# Patient Record
Sex: Male | Born: 1960
Health system: Southern US, Community
[De-identification: ages and names within clinical notes are randomized; demographics above are authoritative.]

## PROBLEM LIST (undated history)

## (undated) DIAGNOSIS — N400 Enlarged prostate without lower urinary tract symptoms: Secondary | ICD-10-CM

---

## 2008-09-04 ENCOUNTER — Emergency Department (HOSPITAL_COMMUNITY): Admission: EM | Admit: 2008-09-04 | Discharge: 2008-09-04 | Payer: Self-pay | Admitting: Emergency Medicine

## 2012-10-14 ENCOUNTER — Emergency Department (HOSPITAL_COMMUNITY): Payer: Self-pay

## 2012-10-14 ENCOUNTER — Encounter (HOSPITAL_COMMUNITY): Payer: Self-pay | Admitting: Physical Medicine and Rehabilitation

## 2012-10-14 ENCOUNTER — Emergency Department (HOSPITAL_COMMUNITY)
Admission: EM | Admit: 2012-10-14 | Discharge: 2012-10-14 | Disposition: A | Discharge status: Home / Self Care | Payer: Self-pay | Attending: Emergency Medicine | Admitting: Emergency Medicine

## 2012-10-14 DIAGNOSIS — R7309 Other abnormal glucose: Secondary | ICD-10-CM | POA: Insufficient documentation

## 2012-10-14 DIAGNOSIS — R319 Hematuria, unspecified: Secondary | ICD-10-CM | POA: Insufficient documentation

## 2012-10-14 DIAGNOSIS — R739 Hyperglycemia, unspecified: Secondary | ICD-10-CM

## 2012-10-14 DIAGNOSIS — R109 Unspecified abdominal pain: Secondary | ICD-10-CM | POA: Insufficient documentation

## 2012-10-14 LAB — COMPREHENSIVE METABOLIC PANEL
ALT: 21 U/L (ref 0–53)
AST: 28 U/L (ref 0–37)
Alkaline Phosphatase: 67 U/L (ref 39–117)
CO2: 30 mEq/L (ref 19–32)
Calcium: 9.4 mg/dL (ref 8.4–10.5)
Chloride: 100 mEq/L (ref 96–112)
GFR calc Af Amer: >90 mL/min (ref >90)
GFR calc non Af Amer: >90 mL/min (ref >90)
Glucose, Bld: 117 mg/dL — ABNORMAL HIGH (ref 70–99)
Potassium: 3.5 mEq/L (ref 3.5–5.1)
Sodium: 137 mEq/L (ref 135–145)

## 2012-10-14 LAB — CBC WITH DIFFERENTIAL/PLATELET
Basophils Absolute: 0 10*3/uL (ref 0.0–0.1)
Lymphocytes Relative: 36 % (ref 12–46)
Lymphs Abs: 1.4 10*3/uL (ref 0.7–4.0)
MCV: 84.3 fL (ref 78.0–100.0)
Neutro Abs: 2.2 10*3/uL (ref 1.7–7.7)
Neutrophils Relative %: 57 % (ref 43–77)
Platelets: 200 10*3/uL (ref 150–400)
RBC: 5.02 MIL/uL (ref 4.22–5.81)
RDW: 12.5 % (ref 11.5–15.5)
WBC: 3.9 10*3/uL — ABNORMAL LOW (ref 4.0–10.5)

## 2012-10-14 LAB — URINALYSIS, ROUTINE W REFLEX MICROSCOPIC
Bilirubin Urine: NEGATIVE
Ketones, ur: NEGATIVE mg/dL
Nitrite: NEGATIVE
Specific Gravity, Urine: 1.017 (ref 1.005–1.030)
Urobilinogen, UA: 0.2 mg/dL (ref 0.0–1.0)

## 2012-10-14 LAB — URINE MICROSCOPIC-ADD ON

## 2012-10-14 MED ORDER — IOHEXOL 300 MG/ML  SOLN
100.0000 mL | Freq: Once | INTRAMUSCULAR | Status: AC | PRN
  Administered 2012-10-14: 100 mL via INTRAVENOUS

## 2012-10-14 MED ORDER — ONDANSETRON HCL 4 MG PO TABS: 4.0000 mg | ORAL_TABLET | Freq: Three times a day (TID) | ORAL | Status: DC | PRN

## 2012-10-14 MED ORDER — OXYCODONE-ACETAMINOPHEN 5-325 MG PO TABS: 1.0000 | ORAL_TABLET | ORAL | Status: DC | PRN

## 2012-10-14 MED ORDER — MORPHINE SULFATE 4 MG/ML IJ SOLN
4.0000 mg | Freq: Once | INTRAMUSCULAR | Status: AC | PRN
  Administered 2012-10-14: 4 mg via INTRAVENOUS
  Filled 2012-10-14: qty 1

## 2012-10-14 MED ORDER — ONDANSETRON HCL 4 MG/2ML IJ SOLN
4.0000 mg | INTRAMUSCULAR | Status: DC | PRN
  Administered 2012-10-14: 4 mg via INTRAVENOUS
  Filled 2012-10-14: qty 2

## 2012-10-14 MED ORDER — IOHEXOL 300 MG/ML  SOLN
50.0000 mL | Freq: Once | INTRAMUSCULAR | Status: AC | PRN
  Administered 2012-10-14: 50 mL via ORAL

## 2012-10-14 NOTE — ED Notes (Signed)
CT made aware pt finished drinking oral contrast

## 2012-10-14 NOTE — ED Provider Notes (Signed)
History     CSN: [id number]  Arrival date & time 10/14/12  1138   First MD Initiated Contact with Patient 10/14/12 1227      Chief Complaint  Patient presents with  . Abdominal Pain    (Consider location/radiation/quality/duration/timing/severity/associated sxs/prior treatment) HPI  Patient presents with cc abdominal pain. Patient is a poor historian. Language barrier. Patient's daughter translates. This morning at work he had sudden onset severe abdominal pain that became progressively worse. Patient left to get Pepto bismol at the drug store. Patient states that this pain  Became so severe that he could not drive away form the drug store/.  His pain eased up and he drove directly to the ED. patient states that the pain began in hi RUQ and back then moved down to his RLQ. The pain is described as colicky in nature. At times 10/10 but now 2/10. He has had frequency of urination but denies dysuria, hematuria. Denies nausea, vomiting, diarrhea, hematochezia, melena,. No PMH of similar. He does not drink alcohol.   No past medical history on file.  No past surgical history on file.  History reviewed. No pertinent family history.  History  Substance Use Topics  . Smoking status: Never Smoker   . Smokeless tobacco: Not on file  . Alcohol Use: No      Review of Systems Ten systems reviewed and are negative for acute change, except as noted in the HPI.   Allergies  Review of patient's allergies indicates no known allergies.  Home Medications   Current Outpatient Rx  Name  Route  Sig  Dispense  Refill  . Multiple Vitamin (MULTIVITAMIN WITH MINERALS) TABS   Oral   Take 1 tablet by mouth daily.           BP 136/73  Pulse 80  Temp(Src) 97.9 F (36.6 C) (Oral)  Resp 18  SpO2 98%  Physical Exam  Nursing note and vitals reviewed. Constitutional: He appears well-developed and well-nourished. No distress.  HENT:  Head: Normocephalic and atraumatic.  Eyes:  Conjunctivae are normal. No scleral icterus.  Neck: Normal range of motion. Neck supple.  Cardiovascular: Normal rate, regular rhythm and normal heart sounds.   Pulmonary/Chest: Effort normal and breath sounds normal. No respiratory distress.  Abdominal: Soft. He exhibits no distension and no mass. There is tenderness. There is no rebound and no guarding.  Poorly localized abdominal tenderness, worst in the RLQ. Moderately tender. No peritoneal signs.  Musculoskeletal: He exhibits no edema.  Neurological: He is alert.  Skin: Skin is warm and dry. He is not diaphoretic.  Psychiatric: His behavior is normal.    ED Course  Procedures (including critical care time)  Labs Reviewed  CBC WITH DIFFERENTIAL - Abnormal; Notable for the following:    WBC 3.9 (*)    MCHC 36.2 (*)    All other components within normal limits  COMPREHENSIVE METABOLIC PANEL - Abnormal; Notable for the following:    Glucose, Bld 117 (*)    All other components within normal limits  URINALYSIS, ROUTINE W REFLEX MICROSCOPIC - Abnormal; Notable for the following:    Hgb urine dipstick TRACE (*)    All other components within normal limits  LIPASE, BLOOD  URINE MICROSCOPIC-ADD ON   No results found.   1. Abdominal  pain, other specified site   2. Hyperglycemia   3. Hematuria       MDM  Patient with c/o abdominal pain. No signs of hernia. Diff. Includes appendicitis, kidney stone.  inflammatory bowel disease. No testiculary pain.  I will send patient for ct  Scan. Lab work up is otherwise negative except for trace hematuria and mild hyperglycemia.     Patient CT scan negative. His pain is resolved. Will d.c with pain medicine and have advised the patient to follow up with PCP. Patient is nontoxic, nonseptic appearing, in no apparent distress.  Patient's pain and other symptoms adequately managed in emergency department.  Fluid bolus given.  Labs, imaging and vitals reviewed.  Patient does not meet the SIRS or  Sepsis criteria.  On repeat exam patient does not have a surgical abdomin and there are nor peritoneal signs.  No indication of appendicitis, bowel obstruction, bowel perforation, cholecystitis, diverticulitis,  Patient discharged home with symptomatic treatment and given strict instructions for follow-up with their primary care physician.  I have also discussed reasons to return immediately to the ER.  Patient expresses understanding and agrees with plan.            Arthor Captain, PA-C 10/14/12 2111  Arthor Captain, PA-C 10/14/12 2125

## 2012-10-14 NOTE — ED Notes (Signed)
Abigail, PA back at the bedside with patient and family

## 2012-10-14 NOTE — ED Notes (Signed)
Abigail, PA at the bedside.  

## 2012-10-14 NOTE — ED Notes (Signed)
Pt presents to department for evaluation of epigastric pain. Onset this morning @ 09:45am. Denies nausea/vomiting/diarrhea. Pt is alert and oriented x4. 8/10 pain at the time.

## 2012-10-15 NOTE — ED Provider Notes (Signed)
Medical screening examination/treatment/procedure(s) were performed by non-physician practitioner and as supervising physician I was immediately available for consultation/collaboration.   Shaylah Mcghie B. Ragna Kramlich, MD 10/15/12 1936 

## 2013-11-02 ENCOUNTER — Ambulatory Visit: Payer: Self-pay

## 2013-12-11 ENCOUNTER — Ambulatory Visit (INDEPENDENT_AMBULATORY_CARE_PROVIDER_SITE_OTHER): Payer: No Typology Code available for payment source | Admitting: Family Medicine

## 2013-12-11 ENCOUNTER — Encounter: Payer: Self-pay | Admitting: Family Medicine

## 2013-12-11 VITALS — BP 121/68 | HR 86 | Temp 97.8°F | Resp 20 | Ht 72.0 in | Wt 146.0 lb

## 2013-12-11 DIAGNOSIS — Z1211 Encounter for screening for malignant neoplasm of colon: Secondary | ICD-10-CM

## 2013-12-11 DIAGNOSIS — R3915 Urgency of urination: Secondary | ICD-10-CM

## 2013-12-11 DIAGNOSIS — R3989 Other symptoms and signs involving the genitourinary system: Secondary | ICD-10-CM

## 2013-12-11 DIAGNOSIS — M545 Low back pain, unspecified: Secondary | ICD-10-CM

## 2013-12-11 DIAGNOSIS — R399 Unspecified symptoms and signs involving the genitourinary system: Secondary | ICD-10-CM

## 2013-12-11 DIAGNOSIS — R350 Frequency of micturition: Secondary | ICD-10-CM

## 2013-12-11 DIAGNOSIS — K5900 Constipation, unspecified: Secondary | ICD-10-CM

## 2013-12-11 DIAGNOSIS — Z789 Other specified health status: Secondary | ICD-10-CM

## 2013-12-11 DIAGNOSIS — K644 Residual hemorrhoidal skin tags: Secondary | ICD-10-CM

## 2013-12-11 DIAGNOSIS — R1032 Left lower quadrant pain: Secondary | ICD-10-CM

## 2013-12-11 LAB — CBC WITH DIFFERENTIAL/PLATELET
BASOS PCT: 1 % (ref 0–1)
Basophils Absolute: 0 10*3/uL (ref 0.0–0.1)
EOS ABS: 0 10*3/uL (ref 0.0–0.7)
Eosinophils Relative: 1 % (ref 0–5)
HEMATOCRIT: 44 % (ref 39.0–52.0)
HEMOGLOBIN: 15.3 g/dL (ref 13.0–17.0)
LYMPHS ABS: 1.1 10*3/uL (ref 0.7–4.0)
Lymphocytes Relative: 30 % (ref 12–46)
MCH: 30.4 pg (ref 26.0–34.0)
MCHC: 34.8 g/dL (ref 30.0–36.0)
MCV: 87.3 fL (ref 78.0–100.0)
MONO ABS: 0.4 10*3/uL (ref 0.1–1.0)
MONOS PCT: 10 % (ref 3–12)
Neutro Abs: 2 10*3/uL (ref 1.7–7.7)
Neutrophils Relative %: 58 % (ref 43–77)
Platelets: 250 10*3/uL (ref 150–400)
RBC: 5.04 MIL/uL (ref 4.22–5.81)
RDW: 14 % (ref 11.5–15.5)
WBC: 3.5 10*3/uL — ABNORMAL LOW (ref 4.0–10.5)

## 2013-12-11 LAB — IFOBT (OCCULT BLOOD): IFOBT: POSITIVE

## 2013-12-11 MED ORDER — HYDROCORTISONE 2.5 % RE CREA: 1.0000 "application " | TOPICAL_CREAM | Freq: Two times a day (BID) | RECTAL | Status: DC

## 2013-12-11 MED ORDER — POLYETHYLENE GLYCOL 3350 17 GM/SCOOP PO POWD: 17.0000 g | Freq: Every day | ORAL | Status: DC

## 2013-12-11 NOTE — Progress Notes (Signed)
Subjective:    Patient ID: Troy Harrison, male    DOB: [birth year], 53 y.o.   MRN: [id number]  HPI Patient in office to establish care. Patient is accompanied by interpreter. He primarily speaks spanish, yet understands [place].  States that he was seen by urgent care prior to establishing care.   Patient reports that he is having urinary frequency, urgency and trouble starting a stream at times. Symptoms have been occurring intermittently over the past 2 weeks. Patient states that he does not drink much water and has been working outside. He reports that he is not sexually active. Patient denies dysuria, pain, or bladder spasms.   Patient also complaining of blood in stool. He reports that he has had blood on tissue following defecation on several occasions. Patient reports that stools are hard and he finds himself straining with defecation at times. Patient reports that he has never had a colonoscopy. Patient reports occasional constipation and denies frank bleeding with defecation, nausea, vomiting, or diarrhea.    Review of Systems  Constitutional: Negative.   Gastrointestinal: Positive for nausea and blood in stool.  Genitourinary: Positive for urgency, frequency and difficulty urinating.  Musculoskeletal: Positive for back pain and myalgias.  Skin: Negative.   Allergic/Immunologic: Negative.   Neurological: Negative.  Negative for dizziness.  Hematological: Negative.   Psychiatric/Behavioral: Negative.        Objective:   Physical Exam  Constitutional: He is oriented to person, place, and time. He appears well-developed and well-nourished.  HENT:  Head: Normocephalic and atraumatic.  Right Ear: External ear normal.  Eyes: Conjunctivae are normal. Pupils are equal, round, and reactive to light.  Neck: Normal range of motion. Neck supple.  Cardiovascular: Normal rate, regular rhythm and normal heart sounds.   Pulmonary/Chest: Effort normal and breath sounds normal.   Abdominal: Soft. Bowel sounds are normal. Hernia confirmed negative in the right inguinal area and confirmed negative in the left inguinal area.  Genitourinary: Testes normal and penis normal. Rectal exam shows external hemorrhoid. Prostate is tender. Right testis shows no swelling and no tenderness. Circumcised.  Musculoskeletal: Normal range of motion.       Lumbar back: He exhibits normal range of motion, no tenderness, no swelling, no edema and no pain.  Lymphadenopathy:       Right: No inguinal adenopathy present.       Left: No inguinal adenopathy present.  Neurological: He is alert and oriented to person, place, and time.  Skin: Skin is warm and dry.  Psychiatric: He has a normal mood and affect. His behavior is normal. Judgment and thought content normal.          Assessment & Plan:  1. Urinary frequency/urgency: Increase water intake to 4-5 glasses per day. Add cranberry juice to fluid regimen. Empty bladder completely. Will check urinalysis.    2. Lower urinary tract symptoms: Patient reports that he has difficulty starting a stream at times. He states that he has not had a prostate exam in years. Checked prostate patient complained of mild tenderness during examination. Will check PSA  3. Constipation: Patient reports that he does not exercise. Recommend that he start a high fiber diet, increase water intake and exercise 3 days per week. Add Miralax 17 g daily  4. Abdominal pain to left lower quadrant: Patient complaining of discomfort to LLQ on palpation. Will start high fiber diet due to periodic constipation. Will check CMP and urinalysis to r/o UTI. Will refer for colonoscopy.   4. Hemorrhoids :  Patient reports that he has had blood on the tissues on wiping after defecation. Check occult blood, positive. External hemorrhoids present. Will start Hydrocortisone 2.5% rectal cream daily as needed. Recommend that patient start a high fiber diet. Added daily Miralax 17 g.   5.  Back pain: Patient complaining of back pain periodically.  Pain not reproducible on assessment. Discussed back exercises at length, demonstrated stretches. Apply warm, moist compresses to back 4 times per day for 20 minutes as needed. Recommend OTC Tylenol 500 mg every 6 hours as needed   Preventative:  Nonsmoker/non alcohol drinker Currently not sexually active Colonoscopy: Referral needed Vaccinations: Tdap 10 years ago, Tdap given without complication   Labs: CMP, UA, PSA, and occult blood RTC: 3 months for CPE Massie MaroonHollis,Bethannie Iglehart M, FNP

## 2013-12-12 LAB — URINALYSIS, COMPLETE
BACTERIA UA: NONE SEEN
Bilirubin Urine: NEGATIVE
Casts: NONE SEEN
Crystals: NONE SEEN
GLUCOSE, UA: NEGATIVE mg/dL
HGB URINE DIPSTICK: NEGATIVE
KETONES UR: NEGATIVE mg/dL
LEUKOCYTES UA: NEGATIVE
Nitrite: NEGATIVE
PROTEIN: NEGATIVE mg/dL
Specific Gravity, Urine: 1.029 (ref 1.005–1.030)
Squamous Epithelial / LPF: NONE SEEN
Urobilinogen, UA: 0.2 mg/dL (ref 0.0–1.0)
pH: 6.5 (ref 5.0–8.0)

## 2013-12-12 LAB — PSA: PSA: 0.5 ng/mL (ref <=4.00)

## 2013-12-12 LAB — COMPLETE METABOLIC PANEL WITH GFR
ALBUMIN: 4.4 g/dL (ref 3.5–5.2)
ALK PHOS: 60 U/L (ref 39–117)
ALT: 23 U/L (ref 0–53)
AST: 29 U/L (ref 0–37)
BUN: 22 mg/dL (ref 6–23)
CO2: 27 mEq/L (ref 19–32)
Calcium: 9.3 mg/dL (ref 8.4–10.5)
Chloride: 102 mEq/L (ref 96–112)
Creat: 1.19 mg/dL (ref 0.50–1.35)
GFR, Est African American: 80 mL/min
GFR, Est Non African American: 69 mL/min
Glucose, Bld: 87 mg/dL (ref 70–99)
POTASSIUM: 4.3 meq/L (ref 3.5–5.3)
SODIUM: 138 meq/L (ref 135–145)
TOTAL PROTEIN: 6.7 g/dL (ref 6.0–8.3)
Total Bilirubin: 0.4 mg/dL (ref 0.2–1.2)

## 2013-12-17 DIAGNOSIS — Z789 Other specified health status: Secondary | ICD-10-CM | POA: Insufficient documentation

## 2013-12-17 DIAGNOSIS — M545 Low back pain, unspecified: Secondary | ICD-10-CM | POA: Insufficient documentation

## 2013-12-17 DIAGNOSIS — R399 Unspecified symptoms and signs involving the genitourinary system: Secondary | ICD-10-CM | POA: Insufficient documentation

## 2013-12-17 DIAGNOSIS — R3915 Urgency of urination: Secondary | ICD-10-CM | POA: Insufficient documentation

## 2013-12-17 DIAGNOSIS — Z1211 Encounter for screening for malignant neoplasm of colon: Secondary | ICD-10-CM | POA: Insufficient documentation

## 2013-12-17 DIAGNOSIS — R1032 Left lower quadrant pain: Secondary | ICD-10-CM | POA: Insufficient documentation

## 2013-12-17 DIAGNOSIS — K644 Residual hemorrhoidal skin tags: Secondary | ICD-10-CM | POA: Insufficient documentation

## 2013-12-17 DIAGNOSIS — R350 Frequency of micturition: Secondary | ICD-10-CM | POA: Insufficient documentation

## 2013-12-17 DIAGNOSIS — K5900 Constipation, unspecified: Secondary | ICD-10-CM | POA: Insufficient documentation

## 2014-03-14 ENCOUNTER — Encounter: Payer: Self-pay | Admitting: Internal Medicine

## 2014-03-14 ENCOUNTER — Ambulatory Visit (INDEPENDENT_AMBULATORY_CARE_PROVIDER_SITE_OTHER): Payer: No Typology Code available for payment source | Admitting: Internal Medicine

## 2014-03-14 VITALS — BP 122/62 | HR 62 | Temp 97.8°F | Resp 16 | Ht 73.0 in | Wt 159.0 lb

## 2014-03-14 DIAGNOSIS — N508 Other specified disorders of male genital organs: Secondary | ICD-10-CM

## 2014-03-14 DIAGNOSIS — G4710 Hypersomnia, unspecified: Secondary | ICD-10-CM

## 2014-03-14 DIAGNOSIS — N5089 Other specified disorders of the male genital organs: Secondary | ICD-10-CM

## 2014-03-14 DIAGNOSIS — K644 Residual hemorrhoidal skin tags: Secondary | ICD-10-CM

## 2014-03-14 DIAGNOSIS — G4730 Sleep apnea, unspecified: Secondary | ICD-10-CM

## 2014-03-14 DIAGNOSIS — Z23 Encounter for immunization: Secondary | ICD-10-CM

## 2014-03-14 DIAGNOSIS — R400 Somnolence: Secondary | ICD-10-CM

## 2014-03-14 DIAGNOSIS — Z0000 Encounter for general adult medical examination without abnormal findings: Secondary | ICD-10-CM

## 2014-03-14 MED ORDER — HYDROCORTISONE 2.5 % RE CREA: 1.0000 "application " | TOPICAL_CREAM | Freq: Two times a day (BID) | RECTAL | Status: DC

## 2014-03-14 NOTE — Progress Notes (Signed)
Patient ID: Troy Harrison, male   DOB: Sep 15, 1960, 53 y.o.   MRN: [id number]   Troy Harrison, is a 53 y.o. male  JXB:[id number]  [id number]:[id number]  DOB - [birth year]  CC:  Chief Complaint  Patient presents with  . Annual Exam       HPI: Troy Harrison is a 53 y.o. male here today for Annual Examination. He reports that he has had problems with nocturnal urgency and decreased stream for more than 2 years. He feels that he has to have increased effort to "pass urine" at night.   He also c/o daytime sleepiness and reports that his wife has observed him with apneic spells at night and his score on the Epworth sleepiness scale is 17 (moderte risk).   Pt also reports that he has a difficult time hearing low tones and in large rooms. He however, does hear at normal tones.   Patient has No headache, No chest pain, No abdominal pain - No Nausea, No new weakness tingling or numbness, No Cough - SOB.  No Known Allergies History reviewed. No pertinent past medical history. Current Outpatient Prescriptions on File Prior to Visit  Medication Sig Dispense Refill  . polyethylene glycol powder (GLYCOLAX/MIRALAX) powder Take 17 g by mouth daily.  3350 g  1  . Multiple Vitamin (MULTIVITAMIN WITH MINERALS) TABS Take 1 tablet by mouth daily.      . ondansetron (ZOFRAN) 4 MG tablet Take 1 tablet (4 mg total) by mouth every 8 (eight) hours as needed for nausea.  10 tablet  0  . oxyCODONE-acetaminophen (PERCOCET) 5-325 MG per tablet Take 1-2 tablets by mouth every 4 (four) hours as needed for pain.  10 tablet  0   No current facility-administered medications on file prior to visit.   History reviewed. No pertinent family history. History   Social History  . Marital Status: Married    Spouse Name: N/A    Number of Children: N/A  . Years of Education: N/A   Occupational History  . Not on file.   Social History Main Topics  . Smoking status: Never Smoker   . Smokeless  tobacco: Not on file  . Alcohol Use: No  . Drug Use: No  . Sexual Activity: Not on file   Other Topics Concern  . Not on file   Social History Narrative  . No narrative on file    Review of Systems: Constitutional: Negative for fever, chills, diaphoresis, activity change, appetite change and fatigue. HENT: Negative for ear pain, nosebleeds, congestion, facial swelling, rhinorrhea, neck pain, neck stiffness and ear discharge.  Eyes: Negative for pain, discharge, redness, itching and visual disturbance. Respiratory: Negative for cough, choking, chest tightness, shortness of breath, wheezing and stridor.  Cardiovascular: Negative for chest pain, palpitations and leg swelling. Gastrointestinal: Negative for abdominal distention. Genitourinary: Negative for dysuria, urgency, frequency, hematuria, flank pain, decreased urine volume, difficulty urinating.  Musculoskeletal: Negative for back pain, joint swelling, arthralgia and gait problem. Neurological: Negative for dizziness, tremors, seizures, syncope, facial asymmetry, speech difficulty, weakness, light-headedness, numbness and headaches.  Hematological: Negative for adenopathy. Does not bruise/bleed easily. Psychiatric/Behavioral: Negative for hallucinations, behavioral problems, confusion, dysphoric mood, decreased concentration and agitation.    Objective:    Filed Vitals:   03/14/14 1112  BP: 122/62  Pulse: 62  Temp: 97.8 F (36.6 C)  Resp: 16    Physical Exam: Constitutional: Patient appears well-developed and well-nourished. No distress. HENT: Normocephalic, atraumatic, External right and left ear normal. Oropharynx is clear and  moist.  Eyes: Conjunctivae and EOM are normal. PERRLA, no scleral icterus. Neck: Normal ROM. Neck supple. No JVD. No tracheal deviation. No thyromegaly. CVS: RRR, S1/S2 +, no murmurs, no gallops, no carotid bruit.  Pulmonary: Effort and breath sounds normal, no stridor, rhonchi, wheezes, rales.   Abdominal: Soft. BS +, no distension, tenderness, rebound or guarding.  Genitalia: Pt with large scrotal volume on rihgt as compared to left which seems to be absent Musculoskeletal: Normal range of motion. No edema and no tenderness.  Lymphadenopathy: No lymphadenopathy noted, cervical, inguinal or axillary Neuro: Alert. Normal reflexes, muscle tone coordination. No cranial nerve deficit. Skin: Skin is warm and dry. No rash noted. Not diaphoretic. No erythema. No pallor. Psychiatric: Normal mood and affect. Behavior, judgment, thought content normal.  Lab Results  Component Value Date   WBC 3.5* 12/11/2013   HGB 15.3 12/11/2013   HCT 44.0 12/11/2013   MCV 87.3 12/11/2013   PLT 250 12/11/2013   Lab Results  Component Value Date   CREATININE 1.19 12/11/2013   BUN 22 12/11/2013   NA 138 12/11/2013   K 4.3 12/11/2013   CL 102 12/11/2013   CO2 27 12/11/2013    No results found for this basename: HGBA1C   Lipid Panel  No results found for this basename: chol, trig, hdl, cholhdl, vldl, ldlcalc       Assessment and plan:   1. Annual physical exam - EKG 12-Lead - Lipid panel - Vit D  25 hydroxy (rtn osteoporosis monitoring)  2. Need for Tdap vaccination - Tdap vaccine greater than or equal to 7yo IM  3. Immunization due - Flu Vaccine QUAD 36+ mos PF IM (Fluarix Quad PF)  4. Hemorrhoids, external without complications - hydrocortisone (ANUSOL-HC) 2.5 % rectal cream; Place 1 application rectally 2 (two) times daily.  Dispense: 30 g; Refill: 2  5. 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 It is unclear if he has a left undescend scrotum on mass int he left scrotum. Both of which indicate risk for testicular cancer - US Scrotum; Future  6. Urinary Dysfunction - Prostate examination normal and PSA was normal. His urinalysis was also found to be normal . The fact that he only has symptoms at night does not fir any particular pattern.    7. Positive Hemoccult - Pt did have a positive guaiac and has been referred for Colonoscopy both for screening and postitive hemoccult. However his normal CBC is reassuring.    Follow-up in 3 months.  The patient was given clear instructions to go to ER or return to medical center if symptoms don't improve, worsen or new problems develop. The patient verbalized understanding. The patient was told to call to get lab results if they haven't heard anything in the next week.     This note has been created with Education officer, environmental. Any transcriptional errors are unintentional.    Kenia Teagarden A., MD [facility] [place], [place] [phone]   03/14/2014, 12:15 PM

## 2014-03-15 LAB — VITAMIN D 25 HYDROXY (VIT D DEFICIENCY, FRACTURES): Vit D, 25-Hydroxy: 28 ng/mL — ABNORMAL LOW (ref 30–89)

## 2014-03-15 LAB — LIPID PANEL
CHOL/HDL RATIO: 2.1 ratio
Cholesterol: 172 mg/dL (ref 0–200)
HDL: 82 mg/dL (ref >39)
LDL CALC: 73 mg/dL (ref 0–99)
TRIGLYCERIDES: 85 mg/dL (ref <150)
VLDL: 17 mg/dL (ref 0–40)

## 2014-03-21 ENCOUNTER — Ambulatory Visit: Payer: No Typology Code available for payment source | Admitting: Internal Medicine

## 2014-03-21 ENCOUNTER — Encounter: Payer: Self-pay | Admitting: Internal Medicine

## 2014-03-21 NOTE — Progress Notes (Unsigned)
Patient here to establish care Patient states was seen last week at [facility] for  An issue with his prostate Patient complains of having hemorrhoids as well

## 2014-05-08 ENCOUNTER — Ambulatory Visit: Payer: No Typology Code available for payment source | Attending: Internal Medicine

## 2014-06-10 ENCOUNTER — Ambulatory Visit: Payer: Self-pay | Attending: Internal Medicine

## 2014-06-18 ENCOUNTER — Ambulatory Visit (HOSPITAL_BASED_OUTPATIENT_CLINIC_OR_DEPARTMENT_OTHER): Payer: Self-pay | Attending: Internal Medicine | Admitting: Radiology

## 2014-06-18 DIAGNOSIS — G4700 Insomnia, unspecified: Secondary | ICD-10-CM | POA: Insufficient documentation

## 2014-06-18 DIAGNOSIS — R400 Somnolence: Secondary | ICD-10-CM

## 2014-06-18 DIAGNOSIS — G4733 Obstructive sleep apnea (adult) (pediatric): Secondary | ICD-10-CM | POA: Insufficient documentation

## 2014-06-18 DIAGNOSIS — G4710 Hypersomnia, unspecified: Secondary | ICD-10-CM

## 2014-06-18 DIAGNOSIS — G4730 Sleep apnea, unspecified: Secondary | ICD-10-CM

## 2014-06-18 DIAGNOSIS — R0683 Snoring: Secondary | ICD-10-CM

## 2014-06-22 DIAGNOSIS — G4730 Sleep apnea, unspecified: Secondary | ICD-10-CM

## 2014-06-22 DIAGNOSIS — G4710 Hypersomnia, unspecified: Secondary | ICD-10-CM

## 2014-06-22 DIAGNOSIS — R0683 Snoring: Secondary | ICD-10-CM

## 2014-06-22 NOTE — Sleep Study (Signed)
   NAME: Troy Harrison DATE OF BIRTH:  [birth year] MEDICAL RECORD NUMBER [id number]  LOCATION: [facility]  PHYSICIAN: Karlisa Gaubert D  DATE OF STUDY: 06/18/2014  SLEEP STUDY TYPE: Nocturnal Polysomnogram               REFERRING PHYSICIAN: Altha Harm, MD  INDICATION FOR STUDY: Hypersomnia with sleep apnea  EPWORTH SLEEPINESS SCORE:   9/24 HEIGHT:   6 feet WEIGHT:   170 pounds    BMI 23 NECK SIZE: 14 in.  MEDICATIONS: Charted for review  SLEEP ARCHITECTURE: Total sleep time 152 minutes with sleep efficiency 35.6%. Stage I was 13.8%, stage II 77.3%, stage III absent, REM 8.9% of total sleep time. Sleep latency 143 minutes, REM latency 228.5 minutes, awake after sleep onset 133.5 minutes, arousal index 60, bedtime medication: None  RESPIRATORY DATA: Apnea hypopnea index (AHI) 5.5 per hour 14 total events scored including 6 obstructive apneas, 1 central apnea, 3 mixed apneas, 4 hypopneas. All events were associated with supine sleep position REM AHI 0. There were not enough events to permit application of split protocol CPAP titration.  OXYGEN DATA: Very loud snoring with oxygen desaturation to a nadir of 90% and mean saturation 96.4% on room air  CARDIAC DATA: Sinus rhythm with PACs, PVCs and intervals of supraventricular tachycardia.  MOVEMENT/PARASOMNIA: 28 limb jerks were counted of which 8 were associated with arousal or awakening for periodic limb movement with arousal index of 3.2 per hour. No bathroom trips. He was described by the technician as "extremely restless throughout the recording".  IMPRESSION/ RECOMMENDATION:   1) Significant difficulty initiating and maintaining sleep. Sustained sleep was not achieved until after 1 AM and he was then awake again intermittently and for about 45 minutes between 2 AM and 3 AM. If this is representative of his sleep pattern at home, consider management as insomnia 2) Minimal obstructive sleep  apnea/hypopnea syndrome, AHI 5.5 per hour with supine events. The normal range for adults is an AHI from 0-5 events per hour. Very loud snoring with oxygen desaturation to a nadir of 90% and mean oxygen saturation 96.4% on room air. Disproportionately loud snoring suggests the possibility of upper airway obstruction such as nasal obstruction or large tonsils, which might be addressed.   Troy Harrison Diplomate, American Board of Sleep Medicine  ELECTRONICALLY SIGNED ON:  06/22/2014, 10:54 AM [facility] PH: [phone]   FX: [phone] ACCREDITED BY THE AMERICAN ACADEMY OF SLEEP MEDICINE

## 2016-01-12 ENCOUNTER — Ambulatory Visit (INDEPENDENT_AMBULATORY_CARE_PROVIDER_SITE_OTHER): Payer: Self-pay

## 2016-01-12 ENCOUNTER — Encounter (HOSPITAL_COMMUNITY): Payer: Self-pay | Admitting: Emergency Medicine

## 2016-01-12 ENCOUNTER — Ambulatory Visit (HOSPITAL_COMMUNITY)
Admission: EM | Admit: 2016-01-12 | Discharge: 2016-01-12 | Disposition: A | Discharge status: Home / Self Care | Payer: Self-pay | Attending: Internal Medicine | Admitting: Internal Medicine

## 2016-01-12 DIAGNOSIS — K5900 Constipation, unspecified: Secondary | ICD-10-CM

## 2016-01-12 DIAGNOSIS — R140 Abdominal distension (gaseous): Secondary | ICD-10-CM

## 2016-01-12 DIAGNOSIS — R350 Frequency of micturition: Secondary | ICD-10-CM

## 2016-01-12 LAB — POCT URINALYSIS DIP (DEVICE)
Bilirubin Urine: NEGATIVE
Glucose, UA: NEGATIVE mg/dL
Ketones, ur: NEGATIVE mg/dL
LEUKOCYTES UA: NEGATIVE
NITRITE: NEGATIVE
PROTEIN: NEGATIVE mg/dL
SPECIFIC GRAVITY, URINE: 1.015 (ref 1.005–1.030)
UROBILINOGEN UA: 0.2 mg/dL (ref 0.0–1.0)
pH: 7.5 (ref 5.0–8.0)

## 2016-01-12 MED ORDER — POLYETHYLENE GLYCOL 3350 17 GM/SCOOP PO POWD: 17.0000 g | Freq: Every day | ORAL | 0 refills | Status: AC

## 2016-01-12 MED ORDER — TAMSULOSIN HCL 0.4 MG PO CAPS: 0.4000 mg | ORAL_CAPSULE | Freq: Every day | ORAL | 0 refills | Status: AC

## 2016-01-12 NOTE — ED Provider Notes (Signed)
[facility]    CSN: [id number] Arrival date & time: 01/12/16  1113  First Provider Contact:  None       History   Chief Complaint Stomach pain   HPI Troy Harrison is a 55 y.o. male. He presents today with several weeks history of urinary hesitancy, small volumes, intermittent dysuria. Nocturia 4-5 times per night. This is worse in the last 2-3 weeks. In the last 3 days he has had some sensation of bloating. Has a chronic history of unformed stools for the last 8 or 10 years, no real change. However, this morning he had a smaller bowel movements than usual, and is little concerned about this. He also acknowledges mild testicular discomfort of uncertain duration. No rash. No abdominal pain at present, does intermittently have crampiness of the right and left lower quadrants. Describes appetite is being a little diminished, little bit of nausea, no vomiting. Tactile temperature. Mild fatigue.   HPI  History reviewed. No pertinent past medical history.  Patient Active Problem List   Diagnosis Date Noted  . Language barrier to communication 12/17/2013  . Unspecified constipation 12/17/2013  . Hemorrhoids, external without complications 12/17/2013  . Urinary frequency 12/17/2013  . Urinary urgency 12/17/2013  . Midline low back pain without sciatica 12/17/2013  . Lower urinary tract symptoms 12/17/2013  . Abdominal pain, left lower quadrant 12/17/2013  . Encounter for screening colonoscopy 12/17/2013    History reviewed. No pertinent surgical history.     Home Medications    Prior to Admission medications   Medication Sig Start Date End Date Taking? Authorizing Provider  naproxen sodium (FLANAX PAIN RELIEF) 220 MG tablet Take 220 mg by mouth 2 (two) times daily with a meal.   Yes Historical Provider, MD  Multiple Vitamin (MULTIVITAMIN WITH MINERALS) TABS Take 1 tablet by mouth daily.    Historical Provider, MD    Family History History reviewed. No  pertinent family history.  Social History Social History  Substance Use Topics  . Smoking status: Never Smoker  . Smokeless tobacco: Never Used  . Alcohol use No     Allergies   Review of patient's allergies indicates no known allergies.   Review of Systems Review of Systems  All other systems reviewed and are negative.    Physical Exam  Updated Vital Signs BP 130/66 (BP Location: Left Arm)   Pulse 61   Temp 97.4 F (36.3 C) (Oral)   Resp 12   SpO2 100%  Physical Exam  Constitutional: He appears well-developed and well-nourished.  HENT:  Head: Normocephalic and atraumatic.  Eyes: Conjunctivae are normal.  Neck: Neck supple.  Cardiovascular: Normal rate and regular rhythm.   No murmur heard. Pulmonary/Chest: Effort normal and breath sounds normal. No respiratory distress.  Abdominal: Soft. He exhibits no distension and no mass. There is no rebound and no guarding.  Very mild tenderness to deep palpation in the left lower quadrant.  Musculoskeletal: He exhibits no edema.  Neurological: He is alert.  Skin: Skin is warm and dry. No rash noted.  Psychiatric: He has a normal mood and affect.  Nursing note and vitals reviewed.    UC Treatments / Results   Results for orders placed or performed during the hospital encounter of 01/12/16  POCT urinalysis dip (device)  Result Value Ref Range   Glucose, UA NEGATIVE NEGATIVE mg/dL   Bilirubin Urine NEGATIVE NEGATIVE   Ketones, ur NEGATIVE NEGATIVE mg/dL   Specific Gravity, Urine 1.015 1.005 - 1.030  Hgb urine dipstick TRACE (A) NEGATIVE   pH 7.5 5.0 - 8.0   Protein, ur NEGATIVE NEGATIVE mg/dL   Urobilinogen, UA 0.2 0.0 - 1.0 mg/dL   Nitrite NEGATIVE NEGATIVE   Leukocytes, UA NEGATIVE NEGATIVE  Urine cytology ancillary only  Result Value Ref Range   Chlamydia Negative    Neisseria gonorrhea Negative     Radiology  CLINICAL DATA:  55 year old male with abdominal discomfort, bloating and  constipation. EXAM: DG ABDOMEN ACUTE W/ 1V CHEST COMPARISON:  CT the abdomen pelvis 10/14/2012. FINDINGS: Lung volumes are normal. No consolidative airspace disease. No pleural effusions. No pneumothorax. No pulmonary nodule or mass noted. Pulmonary vasculature and the cardiomediastinal silhouette are within normal limits. Gas and stool are seen scattered throughout the colon extending to the level of the distal rectum. No pathologic distension of small bowel is noted. No gross evidence of pneumoperitoneum. Atherosclerotic calcifications in the pelvic vessels. IMPRESSION: 1.   1.  Nonobstructive bowel gas pattern. 2. No pneumoperitoneum. 3. No radiographic evidence of acute cardiopulmonary disease. 4. Atherosclerosis. Electronically Signed   By: Trudie Reed M.D.   On: 01/12/2016 13:31  Procedures Procedures (including critical care time) None   Final Clinical Impressions(s) / UC Diagnoses   Final diagnoses:  Bloating  Urinary frequency  Constipation, unspecified constipation type   Prescription of tamsulosin to try for a month, to see if improvement in urinary symptoms occurs.  Follow up with a primary care provider or urologist for further evaluation and management of urinary symptoms.  Prescription for polyethylene glycol (miralax) to try, for decreased frequency bowel movements.  Xray of abdomen was unremarkable today.  Urinalysis was unremarkable today.        Eustace Moore, MD 01/27/16 [id number]

## 2016-01-12 NOTE — ED Notes (Signed)
Patient's bladder showed a residual post void volume of 80 mls.

## 2016-01-12 NOTE — ED Triage Notes (Signed)
Video translator # [id number] used for triage and physician evaluation. The patient presented to the [facility] with a complaint of abdominal pain x 3 weeks..the patient stated that he has been having bilateral testicular pain and decreased urinary output. The patient also reported constipation.

## 2016-01-12 NOTE — ED Provider Notes (Signed)
CSN: [id number]     Arrival date & time 01/12/16  1113 History   None    No chief complaint on file.  (Consider location/radiation/quality/duration/timing/severity/associated sxs/prior Treatment) Patient c/o hard bm yesterday and some abdominal bloating and abdominal discomfort.    Abdominal Pain  Pain location:  Generalized Pain quality: bloating   Pain radiates to:  Does not radiate Pain severity:  No pain Timing:  Intermittent Progression:  Waxing and waning Chronicity:  New Context: eating   Relieved by:  Nothing Worsened by:  Nothing Ineffective treatments:  None tried Associated symptoms: constipation     No past medical history on file. No past surgical history on file. No family history on file. Social History  Substance Use Topics  . Smoking status: Never Smoker  . Smokeless tobacco: Not on file  . Alcohol use No    Review of Systems  Constitutional: Negative.   HENT: Negative.   Respiratory: Negative.   Cardiovascular: Negative.   Gastrointestinal: Positive for abdominal pain and constipation.  Endocrine: Negative.   Genitourinary: Negative.   Musculoskeletal: Negative.   Skin: Negative.   Allergic/Immunologic: Negative.   Neurological: Negative.   Psychiatric/Behavioral: Negative.     Allergies  Review of patient's allergies indicates no known allergies.  Home Medications   Prior to Admission medications   Medication Sig Start Date End Date Taking? Authorizing Provider  hydrocortisone (ANUSOL-HC) 2.5 % rectal cream Place 1 application rectally 2 (two) times daily. 03/14/14   Altha Harm, MD  Multiple Vitamin (MULTIVITAMIN WITH MINERALS) TABS Take 1 tablet by mouth daily.    Historical Provider, MD  ondansetron (ZOFRAN) 4 MG tablet Take 1 tablet (4 mg total) by mouth every 8 (eight) hours as needed for nausea. 10/14/12   Arthor Captain, PA-C  oxyCODONE-acetaminophen (PERCOCET) 5-325 MG per tablet Take 1-2 tablets by mouth every 4 (four)  hours as needed for pain. 10/14/12   Arthor Captain, PA-C  polyethylene glycol powder (GLYCOLAX/MIRALAX) powder Take 17 g by mouth daily. 12/11/13   Massie Maroon, FNP   Meds Ordered and Administered this Visit  Medications - No data to display  BP 130/66 (BP Location: Left Arm)   Pulse 61   Temp 97.4 F (36.3 C) (Oral)   Resp 12   SpO2 100%  No data found.   Physical Exam  Constitutional: He appears well-developed and well-nourished.  HENT:  Head: Normocephalic and atraumatic.  Eyes: Conjunctivae and EOM are normal. Pupils are equal, round, and reactive to light.  Neck: Normal range of motion. Neck supple.  Cardiovascular: Normal rate, regular rhythm and normal heart sounds.   Pulmonary/Chest: Effort normal and breath sounds normal.  Abdominal: Soft. Bowel sounds are normal. He exhibits distension.    Urgent Care Course   Clinical Course    Procedures (including critical care time)  Labs Review Labs Reviewed - No data to display  Imaging Review No results found.   Visual Acuity Review  Right Eye Distance:   Left Eye Distance:   Bilateral Distance:    Right Eye Near:   Left Eye Near:    Bilateral Near:         MDM  Bloating Constipation  Miralax 17 grams qd  Push po fluids Add fiber to diet  Follow up prn    Deatra Canter, FNP 01/15/16 1651

## 2016-01-12 NOTE — Discharge Instructions (Addendum)
Prescription of tamsulosin to try for a month, to see if improvement in urinary symptoms occurs.  Follow up with a primary care provider or urologist for further evaluation and management of urinary symptoms.  Prescription for polyethylene glycol (miralax) to try, for decreased frequency bowel movements.  Xray of abdomen was unremarkable today.  Urinalysis was unremarkable today.

## 2016-01-13 LAB — URINE CYTOLOGY ANCILLARY ONLY
Chlamydia: NEGATIVE
Neisseria Gonorrhea: NEGATIVE

## 2016-02-03 ENCOUNTER — Other Ambulatory Visit: Payer: Self-pay

## 2016-02-03 ENCOUNTER — Ambulatory Visit (HOSPITAL_COMMUNITY)
Admission: EM | Admit: 2016-02-03 | Discharge: 2016-02-03 | Disposition: A | Discharge status: Home / Self Care | Payer: Self-pay | Attending: Family Medicine | Admitting: Family Medicine

## 2016-02-03 ENCOUNTER — Encounter (HOSPITAL_COMMUNITY): Payer: Self-pay | Admitting: *Deleted

## 2016-02-03 DIAGNOSIS — R42 Dizziness and giddiness: Secondary | ICD-10-CM

## 2016-02-03 LAB — POCT I-STAT, CHEM 8
BUN: 18 mg/dL (ref 6–20)
CREATININE: 0.8 mg/dL (ref 0.61–1.24)
Calcium, Ion: 1.18 mmol/L (ref 1.13–1.30)
Chloride: 100 mmol/L — ABNORMAL LOW (ref 101–111)
Glucose, Bld: 85 mg/dL (ref 65–99)
HEMATOCRIT: 47 % (ref 39.0–52.0)
HEMOGLOBIN: 16 g/dL (ref 13.0–17.0)
Potassium: 4.3 mmol/L (ref 3.5–5.1)
Sodium: 139 mmol/L (ref 135–145)
TCO2: 26 mmol/L (ref 0–100)

## 2016-02-03 MED ORDER — MECLIZINE HCL 25 MG PO TABS: 25.0000 mg | ORAL_TABLET | Freq: Three times a day (TID) | ORAL | 0 refills | Status: DC | PRN

## 2016-02-03 MED FILL — ?MECLIZINE 25 MG TABLET: 25 | 10 days supply | Qty: 30 | Fill #0

## 2016-02-03 NOTE — ED Provider Notes (Signed)
[facility]    CSN: [id number] Arrival date & time: 02/03/16  1017  First Provider Contact:  First MD Initiated Contact with Patient 02/03/16 1028        History   Chief Complaint No chief complaint on file.   HPI Vertell LimberRamon Rosales-Gonzalez is a 55 y.o. male.    Dizziness  Quality:  Lightheadedness Severity:  Moderate Onset quality:  Sudden Duration:  2 days Timing:  Intermittent Chronicity:  New Context: not with loss of consciousness   Relieved by:  None tried Worsened by:  Nothing Ineffective treatments:  None tried Associated symptoms: chest pain, headaches, nausea and palpitations   Associated symptoms: no shortness of breath     No past medical history on file.  Patient Active Problem List   Diagnosis Date Noted  . Language barrier to communication 12/17/2013  . Unspecified constipation 12/17/2013  . Hemorrhoids, external without complications 12/17/2013  . Urinary frequency 12/17/2013  . Urinary urgency 12/17/2013  . Midline low back pain without sciatica 12/17/2013  . Lower urinary tract symptoms 12/17/2013  . Abdominal pain, left lower quadrant 12/17/2013  . Encounter for screening colonoscopy 12/17/2013    No past surgical history on file.     Home Medications    Prior to Admission medications   Medication Sig Start Date End Date Taking? Authorizing Provider  Multiple Vitamin (MULTIVITAMIN WITH MINERALS) TABS Take 1 tablet by mouth daily.    Historical Provider, MD  naproxen sodium (FLANAX PAIN RELIEF) 220 MG tablet Take 220 mg by mouth 2 (two) times daily with a meal.    Historical Provider, MD  polyethylene glycol powder (GLYCOLAX/MIRALAX) powder Take 17 g by mouth daily. 01/12/16 02/11/16  Eustace MooreLaura W Murray, MD  tamsulosin (FLOMAX) 0.4 MG CAPS capsule Take 1 capsule (0.4 mg total) by mouth daily. 01/12/16 02/11/16  Eustace MooreLaura W Murray, MD    Family History No family history on file.  Social History Social History  Substance Use Topics  .  Smoking status: Never Smoker  . Smokeless tobacco: Never Used  . Alcohol use No     Allergies   Review of patient's allergies indicates no known allergies.   Review of Systems Review of Systems  Constitutional: Positive for fatigue.  HENT: Negative for congestion.   Respiratory: Negative.  Negative for shortness of breath.   Cardiovascular: Positive for chest pain and palpitations.  Gastrointestinal: Positive for nausea.  Neurological: Positive for dizziness and headaches.  All other systems reviewed and are negative.    Physical Exam Triage Vital Signs ED Triage Vitals  Enc Vitals Group     BP      Pulse      Resp      Temp      Temp src      SpO2      Weight      Height      Head Circumference      Peak Flow      Pain Score      Pain Loc      Pain Edu?      Excl. in GC?    No data found.   Updated Vital Signs There were no vitals taken for this visit.  Visual Acuity Right Eye Distance:   Left Eye Distance:   Bilateral Distance:    Right Eye Near:   Left Eye Near:    Bilateral Near:     Physical Exam  Constitutional: He is oriented to person, place, and time.  He appears well-developed and well-nourished.  HENT:  Right Ear: External ear normal.  Left Ear: External ear normal.  Mouth/Throat: Oropharynx is clear and moist.  Eyes: Pupils are equal, round, and reactive to light.  Neck: Normal range of motion. Neck supple.  Cardiovascular: Normal rate, regular rhythm, normal heart sounds and intact distal pulses.   Pulmonary/Chest: Effort normal and breath sounds normal.  Abdominal: Soft. Bowel sounds are normal. There is no tenderness.  Lymphadenopathy:    He has no cervical adenopathy.  Neurological: He is alert and oriented to person, place, and time.  Skin: Skin is warm and dry.  Nursing note and vitals reviewed.    UC Treatments / Results  Labs (all labs ordered are listed, but only abnormal results are displayed  I-stat  wnl.  Radiology No results found.  Procedures Procedures (including critical care time)  Medications Ordered in UC Medications - No data to display   ED ECG REPORT   Date: 02/03/2016  Rate: 76  Rhythm: normal sinus rhythm  QRS Axis: normal  Intervals: normal  ST/T Wave abnormalities: normal  Conduction Disutrbances:none  Narrative Interpretation:   Old EKG Reviewed: unchanged  I have personally reviewed the EKG tracing and agree with the computerized printout as noted.   Initial Impression / Assessment and Plan / UC Course  I have reviewed the triage vital signs and the nursing notes.  Pertinent labs & imaging results that were available during my care of the patient were reviewed by me and considered in my medical decision making (see chart for de   Final Clinical Impressions(s) / UC Diagnoses   Final diagnoses:  None    New Prescriptions New Prescriptions   No medications on file     Linna HoffJames D Mikeila Burgen, MD 02/03/16 1119

## 2016-02-03 NOTE — ED Triage Notes (Signed)
Pt  Was   Seen   approx  3  Weeks  Ago  For  abd  Pain/  Constipation     And  Bladder  Issues    Pt  Was  Placed   On  Laxative     And   flomax      He  Reports  Being  Dizzy     And  Having  Vague  abd  Pain   pacifixc  Interpretors  Utilized

## 2016-02-04 ENCOUNTER — Ambulatory Visit: Payer: Self-pay | Attending: Internal Medicine

## 2016-02-06 ENCOUNTER — Inpatient Hospital Stay: Payer: Self-pay | Admitting: Family Medicine

## 2016-03-03 ENCOUNTER — Ambulatory Visit: Payer: Self-pay | Attending: Internal Medicine | Admitting: Internal Medicine

## 2016-03-03 ENCOUNTER — Encounter: Payer: Self-pay | Admitting: Internal Medicine

## 2016-03-03 VITALS — BP 144/75 | HR 68 | Temp 98.1°F | Resp 16 | Wt 169.2 lb

## 2016-03-03 DIAGNOSIS — Z7189 Other specified counseling: Secondary | ICD-10-CM | POA: Insufficient documentation

## 2016-03-03 DIAGNOSIS — N401 Enlarged prostate with lower urinary tract symptoms: Secondary | ICD-10-CM | POA: Insufficient documentation

## 2016-03-03 DIAGNOSIS — Z114 Encounter for screening for human immunodeficiency virus [HIV]: Secondary | ICD-10-CM | POA: Insufficient documentation

## 2016-03-03 DIAGNOSIS — Z1211 Encounter for screening for malignant neoplasm of colon: Secondary | ICD-10-CM

## 2016-03-03 DIAGNOSIS — Z1159 Encounter for screening for other viral diseases: Secondary | ICD-10-CM

## 2016-03-03 DIAGNOSIS — I10 Essential (primary) hypertension: Secondary | ICD-10-CM | POA: Insufficient documentation

## 2016-03-03 DIAGNOSIS — Z0000 Encounter for general adult medical examination without abnormal findings: Secondary | ICD-10-CM | POA: Insufficient documentation

## 2016-03-03 DIAGNOSIS — Z1389 Encounter for screening for other disorder: Secondary | ICD-10-CM | POA: Insufficient documentation

## 2016-03-03 DIAGNOSIS — N400 Enlarged prostate without lower urinary tract symptoms: Secondary | ICD-10-CM

## 2016-03-03 DIAGNOSIS — Z79899 Other long term (current) drug therapy: Secondary | ICD-10-CM | POA: Insufficient documentation

## 2016-03-03 LAB — BASIC METABOLIC PANEL WITH GFR
BUN: 17 mg/dL (ref 7–25)
CALCIUM: 9.4 mg/dL (ref 8.6–10.3)
CO2: 26 mmol/L (ref 20–31)
Chloride: 104 mmol/L (ref 98–110)
Creat: 0.78 mg/dL (ref 0.70–1.33)
GFR, Est Non African American: >89 mL/min (ref >=60)
GLUCOSE: 98 mg/dL (ref 65–99)
Potassium: 4.4 mmol/L (ref 3.5–5.3)
Sodium: 137 mmol/L (ref 135–146)

## 2016-03-03 LAB — PSA: PSA: 0.5 ng/mL (ref <=4.0)

## 2016-03-03 MED ORDER — TAMSULOSIN HCL 0.4 MG PO CAPS: 0.4000 mg | ORAL_CAPSULE | Freq: Every day | ORAL | 3 refills | Status: DC

## 2016-03-03 MED FILL — TAMSULOSIN HCL 0.4 MG CAP: 0.4 | 30 days supply | Qty: 30 | Fill #0

## 2016-03-03 NOTE — Patient Instructions (Signed)
Hiperplasia prosttica benigna  (Benign Prostatic Hypertrophy) La glndula prosttica forma parte del sistema reproductor masculino. Una prstata normal tiene aproximadamente el tamao de una nuez. Produce un lquido que se mezcla con el esperma para formar el semen. Esta glndula rodea la uretra y se ubica en frente del recto y por debajo de la vejiga. La vejiga es el lugar en el que se acumula la orina. La uretra es el conducto que lleva la orina desde la vejiga hacia el exterior del cuerpo. La prstata crece a medida que el hombre envejece. Cuando la prstata se agranda por otras causas que no sean cncer, producen un trastorno llamado hipertrofia prosttica benigna (HPB). Una prstata agrandada puede presionar la uretra. Esto puede dificultar el pasaje de la orina. En las primeras etapas del agrandamiento, la vejiga puede adaptarse al fortalecer los msculos para impulsar la orina en la uretra ms estrecha. Si el problema contina o empeora, requerir tratamiento mdico o quirrgico.  Esta afeccin debe ser controlada por su mdico. La acumulacin de orina en la vejiga puede causar una infeccin. La presin y la infeccin pueden llevar a un dao en la vejiga y a una insuficiencia en los riones (renal). Si es necesario, el mdico podr derivarlo a un especialista en enfermedades del rin y de la prstata (urlogo). CAUSAS  La hiperplasia prosttica benigna es un problema en hombres mayores de 50 aos. Esta afeccin es una parte normal del envejecimiento. Sin embargo, no todos los hombres desarrollarn problemas por esta afeccin. Si el agrandamiento va ms all de la uretra, no habr compresin de la uretra ni resistencia al flujo de la orina. Si el agrandamiento es hacia la uretra y la comprime, experimentar dificultad para orinar.  SNTOMAS   Dificultad para vaciar completamente la vejiga.  Levantarse con frecuencia durante la noche para orinar.  Necesidad de orinar con ms frecuencia durante el  da.  Dificultad para comenzar a eliminar la orina.  Disminucin del tamao y la fuerza del chorro de orina.  Goteo al terminar de orinar.  Dolor al orinar (ms frecuente en caso de infeccin).  Imposibilidad para orinar. En este caso es necesario un tratamiento inmediato.  Desarrollo de una infeccin en el tracto urinario. DIAGNSTICO  Estos estudios ayudarn al mdico a diagnosticar el problema.  Una exhaustiva historia clnica y examen fsico.  Historia de su forma de orinar, con el nmero de veces y la cantidad que orina, la fuerza del chorro de orina y la sensacin de haber vaciado o no la vejiga despus de orinar.  Un estudio despus de vaciar la vejiga que mide la cantidad de orina que queda en la vejiga despus de terminar de orinar.  Examen rectal digital. En el examen rectal digital, el mdico controla la prstata colocando un dedo enguantado y lubricado en el recto para sentir la parte posterior de la prstata. En este estudio se detecta el tamao de la glndula y si hay bultos o crecimientos.  Anlisis de orina (urinlisis).  Estudio de antgeno prosttico especfico. Este es un anlisis de sangre que se utiliza para diagnosticar el cncer de prstata.  Ecografa rectal. En este estudio se utilizan ondas de sonido para producir de manera electrnica una imagen de la glndula prosttica. TRATAMIENTO  Una vez que los sntomas comienzan, el mdico controlar la afeccin. De los hombres que sufren esta afeccin, un tercio tendr sntomas que se estabilizan, un tercio tendr sntomas que mejoran y un tercio tendr sntomas que avanzan durante el primer ao. Los sntomas   leves pueden no necesitar tratamiento. Una simple observacin y exmenes anuales pudiera ser todo lo que necesita. Los medicamentos y la Azerbaijanciruga son opciones para los problemas ms graves. El mdico lo ayudar a tomar una decisin acerca de lo que resulte lo mejor para usted. Dos tipos de medicamentos estn  disponibles para el alivio de los sntomas prostticos:  Hay medicamentos para IT trainerachicar la prstata. Esto ayuda a Asbury Automotive Groupaliviar los sntomas. Estos medicamentos tardan en Scientist, water qualityhacer efecto y pueden pasar meses antes de que se observe alguna mejora.  Los efectos adversos poco frecuentes incluyen problemas con la funcin sexual.  Medicamentos para relajar el msculo de la prstata. Esto tambin The Procter & Gamblealivia la obstruccin al reducir la compresin sobre la uretra. Este grupo de medicamentos funciona ms rpido que aquellos que reducen el tamao de la glndula prosttica. Generalmente se experimenta una mejora en Gannett Coalgunos das o semanas.  Los efectos adversos incluyen [place] Manormareos, Ravennafatiga, [place]braska[place] varios tipos de tratamiento quirrgico disponibles para Paramedicaliviar los sntomas de la prstata:  Reseccin transuretral de la prstata (RTUP): En este tipo de tratamiento, se inserta un instrumento a travs de la abertura de la punta del pene. Se cortan trozos del Solicitorcentro interior de la prstata. Los trozos se retiran a Games developertravs de la misma abertura del pene. Esto mejora la obstruccin y [place] and the Grenadinesayuda a Eastman Kodakaliviar los sntomas.  Incisin transuretral (ITUP): En este procedimiento se hacen pequeos cortes en la prstata. sto hace que se alivie la presin de la prstata sobre la uretra.  Termoterapia transuretral con microondas (TTUM): En este procedimiento se utilizan microondas para crear calor. El calor destruye y extirpa una pequea cantidad de tejido prosttico.  Ablacin transuretral con aguja (ATUA): Este es un procedimiento en el que se utilizan frecuencias de radio para Radio producerhacer lo mismo que en el TTUM.  Coagulacin intersticial con lser (CIL): En este procedimiento se utiliza un rayo lser para hacer lo mismo que en el TTUM y el ATUA.  Electrovaporizacin transuretral (EVTU): En este procedimiento se utilizan electrodos para hacer los mismo que en los procedimientos enumerados anteriormente. SOLICITE ATENCIN MDICA SI:   Lance Mussiene  fiebre.  Siente un dolor inexplicable en la espalda.  Los sntomas no se alivian con los medicamentos recetados.  Los Toys ''R'' Usmedicamentos le causan BB&T Corporationefectos adversos.  La orina se vuelve muy oscura o tiene mal olor.  Siente que la parte inferior del abdomen est distendida y tiene dificultad para Geographical information systems officerorinar. SOLICITE ATENCIN MDICA DE INMEDIATO SI:   Repentinamente no puede orinar. Esto es Radio broadcast assistantuna emergencia. Debe ser atendido inmediatamente.  Observa gran cantidad de sangre o cogulos sanguneos en la orina.  No puede controlar sus problemas urinarios.  Se siente confundido, tiene mareos intensos o se desmaya.  Siente dolor moderado a intenso en la espalda o en un flanco.  Siente escalofros o le sube la fiebre.   Esta informacin no tiene Theme park managercomo fin reemplazar el consejo del mdico. Asegrese de hacerle al mdico cualquier pregunta que tenga.   Document Released: 06/07/2005 Document Revised: 06/12/2013 Elsevier Interactive Patient Education 2016 Arvin[employer]itorElsevier Inc.  -  Hipertensin (Hypertension) El trmino hipertensin es otra forma de denominar a la presin arterial elevada. La presin arterial elevada fuerza al corazn a trabajar ms para bombear la sangre. Una lectura de la presin arterial consta de dos nmeros: uno ms alto sobre uno ms bajo (por ejemplo, 110/72). CUIDADOS EN EL HOGAR   Haga que el mdico le tome nuevamente la presin arterial.  Tome los medicamentos solamente como se lo haya indicado el mdico. Siga  cuidadosamente las indicaciones. Los medicamentos pierden eficacia si omite dosis. El hecho de omitir las dosis tambin [place] el riesgo de otros problemas.  No fume.  Contrlese la presin arterial en su casa como se lo haya indicado el mdico. SOLICITE AYUDA SI:  Piensa que tiene una reaccin a los medicamentos que est tomando.  Tiene mareos o dolores de cabeza reiterados.  Se le inflaman (hinchan) los tobillos.  Tiene problemas de visin. SOLICITE AYUDA DE  INMEDIATO SI:   Tiene un dolor de cabeza muy intenso y est confundido.  Se siente dbil, aturdido o se desmaya.  Tiene dolor en el pecho o el estmago (abdominal).  Tiene vmitos.  No puede respirar Kimberly[employer]ark. ASEGRESE DE QUE:   Comprende estas instrucciones.  Controlar su afeccin.  Recibir ayuda de inmediato si no mejora o si empeora.   Esta informacin no tiene [name] el consejo del mdico. Asegrese de hacerle al mdico cualquier pregunta que tenga.   Document Released: 11/25/2009 Document Revised: 06/12/2013 Elsevier Interactive Patient Education 2016 ArvinMeritor.  - Plan de alimentacin DASH (DASH Eating Plan) DASH es la sigla en ingls de "Enfoques Alimentarios para Detener la Hipertensin". El plan de alimentacin DASH ha demostrado bajar la presin arterial elevada (hipertensin). Los beneficios adicionales para la salud pueden incluir la disminucin del riesgo de diabetes mellitus tipo2, enfermedades cardacas e ictus. Este plan tambin puede ayudar a Geophysical data processor. QU DEBO SABER ACERCA DEL PLAN DE ALIMENTACIN DASH? Para el plan de alimentacin DASH, seguir las siguientes pautas generales:  Elija los alimentos con un valor porcentual diario de sodio de menos del 5% (segn figura en la etiqueta del alimento).  Use hierbas o aderezos sin sal, en lugar de sal de mesa o sal marina.  Consulte al mdico o farmacutico antes de usar sustitutos de la sal.  Coma productos con bajo contenido de sodio, cuya etiqueta suele decir "bajo contenido de sodio" o "sin agregado de sal".  Coma alimentos frescos.  Coma ms verduras, frutas y productos lcteos con bajo contenido de [place].  Elija los cereales integrales. Busque la palabra "integral" en Estate agent de la lista de ingredientes.  Elija el pescado y el pollo o el pavo sin piel ms a menudo que las carnes rojas. Limite el consumo de pescado, carne de ave y carne a 6onzas (170g) por Futures trader.  Limite  el consumo de dulces, postres, azcares y bebidas azucaradas.  Elija las grasas saludables para el corazn.  Limite el consumo de queso a 1onza (28g) por Futures trader.  Consuma ms comida casera y menos de restaurante, de buf y comida rpida.  Limite el consumo de alimentos fritos.  Cocine los alimentos utilizando mtodos que no sean la fritura.  Limite las verduras enlatadas. Si las consume, enjuguelas bien para disminuir el sodio.  Cuando coma en un restaurante, pida que preparen su comida con menos sal o, en lo posible, sin nada de sal. QU ALIMENTOS PUEDO COMER? Pida ayuda a un nutricionista para conocer las necesidades calricas individuales. Cereales Pan de salvado o integral. Arroz integral. Pastas de salvado o integrales. Quinua, trigo burgol y cereales integrales. Cereales con bajo contenido de sodio. Tortillas de harina de maz o de salvado. Pan de maz integral. Galletas saladas integrales. Galletas con bajo contenido de [place]. Vegetales Verduras frescas o congeladas (crudas, al vapor, asadas o grilladas). Jugos de tomate y verduras con contenido bajo o reducido de sodio. Pasta y salsa de tomate con contenido bajo o reducido de sodio. Hoover Brunette  enlatadas con bajo contenido de sodio o reducido de sodio.  Nils Pyle Nils Pyle frescas, en conserva (en su jugo natural) o frutas congeladas. Carnes y otros productos con protenas Carne de res molida (al 85% o ms [place]), carne de res de animales alimentados con pastos o carne de res sin la grasa. Pollo o pavo sin piel. Carne de pollo o de [place]. Cerdo sin la grasa. Todos los pescados y frutos de mar. Huevos. Porotos, guisantes o lentejas secos. Frutos secos y semillas sin sal. Frijoles enlatados sin sal. Lcteos Productos lcteos con bajo contenido de grasas, como [place] o al 1%, quesos reducidos en grasas o al 2%, ricota con bajo contenido de grasas o [employer], o yogur natural con bajo contenido de [place]. Quesos con  contenido bajo o reducido de sodio. Grasas y Writer en barra que no contengan grasas trans. Mayonesa y alios para ensaladas livianos o reducidos en grasas (reducidos en sodio). [place]. Aceites de crtamo, oliva o canola. Mantequilla natural de man o almendra. Otros Palomitas de maz y pretzels sin sal. Los artculos mencionados arriba pueden no ser Raytheon de las bebidas o los alimentos recomendados. Comunquese con el nutricionista para conocer ms opciones. QU ALIMENTOS NO SE RECOMIENDAN? Cereales Pan blanco. Pastas blancas. Arroz blanco. Pan de maz refinado. Bagels y croissants. Galletas saladas que contengan grasas trans. Vegetales Vegetales con crema o fritos. Verduras en salsa de [place]. Verduras enlatadas comunes. Pasta y salsa de tomate en lata comunes. Jugos comunes de tomate y de verduras. Nils Pyle Frutas secas. Fruta enlatada en almbar liviano o espeso. Jugo de frutas. Carnes y otros productos con protenas [place] de carne con Holiday representative. Costillas, alas de pollo, tocineta, salchicha, mortadela, salame, chinchulines, tocino, perros calientes, salchichas alemanas y embutidos envasados. Frutos secos y semillas con sal. Frijoles con sal en lata. Lcteos Leche entera o al 2%, crema, mezcla de [place] y crema, y queso crema. Yogur entero o endulzado. Quesos o queso azul con alto contenido de Neurosurgeon. Cremas no lcteas y coberturas batidas. Quesos procesados, quesos para untar o cuajadas. Condimentos Sal de cebolla y ajo, sal condimentada, sal de mesa y sal marina. Salsas en lata y envasadas. Salsa Worcestershire. Salsa trtara. Salsa barbacoa. Salsa teriyaki. Salsa de soja, incluso la que tiene contenido reducido de [place]. Salsa de carne. Salsa de pescado. Salsa de [place]. Salsa rosada. Rbano picante. Ketchup y mostaza. Saborizantes y tiernizantes para carne. Caldo en cubitos. Salsa picante. Salsa tabasco. Adobos. Aderezos para tacos. Salsas. Grasas y  [address], [place] en barra, [place] de [place], [place], [place] clarificada y Steffanie Rainwater de tocino. Aceites de coco, de palmiste o de palma. Aderezos comunes para ensalada. Otros Pickles y [place]. Palomitas de maz y pretzels con sal. Los artculos mencionados arriba pueden no ser Raytheon de las bebidas y los alimentos que se Theatre stage manager. Comunquese con el nutricionista para obtener ms informacin. DNDE Raelyn Mora MS INFORMACIN? Instituto Nacional del [place], del Pulmn y de Risk manager (National Heart, Lung, and Blood Institute): [email or web address]   Esta informacin no tiene [name] el consejo del mdico. Asegrese de hacerle al mdico cualquier pregunta que tenga.   Document Released: 05/27/2011 Document Revised: 06/28/2014 Elsevier Interactive Patient Education [employer] Inc.

## 2016-03-03 NOTE — Progress Notes (Signed)
Pt is in the office today for establish care and prostate problems Pt states he has seen different doctors about his prostate and was giving different medications for it Pt states he has some discomfort with his bladder

## 2016-03-03 NOTE — Progress Notes (Signed)
Troy Harrison, is a 55 y.o. male  ZOX:[id number]  [id number]  DOB - September 19, 1960  CC:  Chief Complaint  Patient presents with  . Establish Care       HPI: Troy Harrison is a 55 y.o. male here today to establish medical care.  Pt last seen in our clinic 2015.  Recent ed visit 8/17 for vertigo, only took few days of meclizine and sx resolved.  He presents today w/ c/o of incomplete bladder emptying, feels like he does not completely empty, more frequent attempt at urination, especially at night, with little output.  Denies dysuria. No f/c.  bms normal. Eats well.  Per pt, gets occasional abd disomfort, rare, when he is exercising, but not related w/ defecation/bms.  Use to smoke and drink, denies anything currently.  Patient has No headache, No chest pain, No abdominal pain - No Nausea, No new weakness tingling or numbness, No Cough - SOB. Denies bloody stool, no melena/hematochezia, no weight loss or gain.  Interpreter was used to communicate directly with patient for the entire encounter including providing detailed patient instructions.   Review of Systems: Per HPI, o/w all systems reviewed and negative.   No Known Allergies No past medical history on file. Current Outpatient Prescriptions on File Prior to Visit  Medication Sig Dispense Refill  . Multiple Vitamin (MULTIVITAMIN WITH MINERALS) TABS Take 1 tablet by mouth daily.    . meclizine (ANTIVERT) 25 MG tablet Take 1 tablet (25 mg total) by mouth 3 (three) times daily as needed for dizziness. (Patient not taking: Reported on 03/03/2016) 30 tablet 0  . naproxen sodium (FLANAX PAIN RELIEF) 220 MG tablet Take 220 mg by mouth 2 (two) times daily with a meal.     No current facility-administered medications on file prior to visit.    No family history on file. Social History   Social History  . Marital status: Married    Spouse name: N/A  . Number of children: N/A  . Years of education: N/A    Occupational History  . Not on file.   Social History Main Topics  . Smoking status: Never Smoker  . Smokeless tobacco: Never Used  . Alcohol use No  . Drug use: No  . Sexual activity: Not on file   Other Topics Concern  . Not on file   Social History Narrative  . No narrative on file    Objective:   Vitals:   03/03/16 0940  BP: (!) 144/75  Pulse: 68  Resp: 16  Temp: 98.1 F (36.7 C)    Filed Weights   03/03/16 0940  Weight: 169 lb 3.2 oz (76.7 kg)    BP Readings from Last 3 Encounters:  03/03/16 (!) 144/75  02/03/16 145/86  01/12/16 130/66    Physical Exam: Constitutional: Patient appears well-developed and well-nourished. No distress. AAOx3, pleasant.  HENT: Normocephalic, atraumatic, External right and left ear normal. Oropharynx is clear and moist. bilat TMs clear Eyes: Conjunctivae and EOM are normal. PERRL, no scleral icterus. Neck: Normal ROM. Neck supple. No JVD.  CVS: RRR, S1/S2 +, no murmurs, no gallops, no carotid bruit.  Pulmonary: Effort and breath sounds normal, no stridor, rhonchi, wheezes, rales.  Abdominal: Soft. BS +, no distension, tenderness, rebound or guarding.  No hsm. Musculoskeletal: Normal range of motion. No edema and no tenderness.  LE: bilat/ no c/c/e, pulses 2+ bilateral. Neuro: Alert.  muscle tone coordination wnl. No cranial nerve deficit grossly. Skin: Skin is warm and dry. No  rash noted. Not diaphoretic. No erythema. No pallor. Psychiatric: Normal mood and affect. Behavior, judgment, thought content normal.  Lab Results  Component Value Date   WBC 3.5 (L) 12/11/2013   HGB 16.0 02/03/2016   HCT 47.0 02/03/2016   MCV 87.3 12/11/2013   PLT 250 12/11/2013   Lab Results  Component Value Date   CREATININE 0.80 02/03/2016   BUN 18 02/03/2016   NA 139 02/03/2016   K 4.3 02/03/2016   CL 100 (L) 02/03/2016   CO2 27 12/11/2013    No results found for: HGBA1C Lipid Panel     Component Value Date/Time   CHOL 172  03/14/2014 1220   TRIG 85 03/14/2014 1220   HDL 82 03/14/2014 1220   CHOLHDL 2.1 03/14/2014 1220   VLDL 17 03/14/2014 1220   LDLCALC 73 03/14/2014 1220       Depression screen PHQ 2/9 03/14/2014 12/11/2013  Decreased Interest 1 0  Down, Depressed, Hopeless 0 0  PHQ - 2 Score 1 0    Assessment and plan:   1. BPH (benign prostatic hypertrophy) - trial flomax 0.4mg  qday - PSA  2. Need for hepatitis C screening test - Hepatitis C antibody  3. Encounter for screening for HIV - HIV antibody (with reflex)  4. Colon cancer screening - Ambulatory referral to Gastroenterology  5. Health care maintenance - BASIC METABOLIC PANEL WITH GFR  6. HTN (hypertension), benign Slight elevation today, dw pt recds, low salt diet, increase exercise flomax may drop his bp some as well. Pt wants to hold off on starting rx for now, and try lifestyle modifications   Return in about 3 months (around 06/02/2016).  The patient was given clear instructions to go to ER or return to medical center if symptoms don't improve, worsen or new problems develop. The patient verbalized understanding. The patient was told to call to get lab results if they haven't heard anything in the next week.    This note has been created with Education officer, environmentalDragon speech recognition software and smart phrase technology. Any transcriptional errors are unintentional.   Pete Glatterawn T Mirielle Byrum, MD, MBA/MHA [facility] [place], [place] [phone]   03/03/2016, 12:58 PM

## 2016-03-04 LAB — HIV ANTIBODY (ROUTINE TESTING W REFLEX): HIV: NONREACTIVE

## 2016-03-04 LAB — HEPATITIS C ANTIBODY: HCV AB: NEGATIVE

## 2016-03-05 ENCOUNTER — Telehealth: Payer: Self-pay

## 2016-03-05 NOTE — Telephone Encounter (Signed)
[employer] Kelby FamManuel Id: [id number] Contacted pt to go over lab results pt didn't answer lvm for pt to give me a call back at his earliest convenience

## 2016-03-10 ENCOUNTER — Ambulatory Visit: Payer: Self-pay | Attending: Internal Medicine

## 2016-03-31 ENCOUNTER — Other Ambulatory Visit: Payer: Self-pay

## 2016-03-31 ENCOUNTER — Telehealth: Payer: Self-pay

## 2016-03-31 MED FILL — TAMSULOSIN HCL 0.4 MG CAP: 0.4 | 90 days supply | Qty: 90 | Fill #1

## 2016-03-31 NOTE — Telephone Encounter (Signed)
Gastroenterology Pre-Procedure Review  Request Date: Requesting Physician:   PATIENT REVIEW QUESTIONS: The patient responded to the following health history questions as indicated:    1. Diabetes Melitis:NO 2. Joint replacements in the past 12 months: NO 3. Major health problems in the past 3 months: NO 4. Has an artificial valve or MVP: NO 5. Has a defibrillator: NO 6. Has been advised in past to take antibiotics in advance of a procedure like teeth cleaning: NO 7. Family history of colon cancer: NO,PROSTATE 8. Alcohol Use: NO 9. History of sleep apnea: NO 10. History of coronary artery or other vascular stents placed within the last 12 months: NO    MEDICATIONS & ALLERGIES:    Patient reports the following regarding taking any blood thinners:   Plavix? NO Aspirin? NO Coumadin? NO Brilinta? NO Xarelto? NO Eliquis? NO Pradaxa? NO Savaysa? NO Effient? NO  Patient confirms/reports the following medications:  Current Outpatient Prescriptions  Medication Sig Dispense Refill  . tamsulosin (FLOMAX) 0.4 MG CAPS capsule Take 1 capsule (0.4 mg total) by mouth daily. 90 capsule 3  . meclizine (ANTIVERT) 25 MG tablet Take 1 tablet (25 mg total) by mouth 3 (three) times daily as needed for dizziness. (Patient not taking: Reported on 03/31/2016) 30 tablet 0  . Multiple Vitamin (MULTIVITAMIN WITH MINERALS) TABS Take 1 tablet by mouth daily.    . naproxen sodium (FLANAX PAIN RELIEF) 220 MG tablet Take 220 mg by mouth 2 (two) times daily with a meal.     No current facility-administered medications for this visit.     Patient confirms/reports the following allergies:  No Known Allergies  No orders of the defined types were placed in this encounter.   AUTHORIZATION INFORMATION Primary Insurance: ,  ID #:   Group #:  Pre-Cert / Auth required:  Pre-Cert / Auth #:     SCHEDULE INFORMATION: Procedure has been scheduled as follows:  Date: , Time:   Location:   This  Gastroenterology Pre-Precedure Review Form is being routed to the following provider(s):

## 2016-04-05 NOTE — Telephone Encounter (Signed)
Appropriate.

## 2016-04-06 ENCOUNTER — Other Ambulatory Visit: Payer: Self-pay

## 2016-04-06 DIAGNOSIS — Z1211 Encounter for screening for malignant neoplasm of colon: Secondary | ICD-10-CM

## 2016-04-06 NOTE — Telephone Encounter (Signed)
Pt is set up for 04/26/16 @ 12:00 pm. He is aware I am sending him a letter to come by to pick up free sample since he does not have insurance.

## 2016-04-26 ENCOUNTER — Encounter (HOSPITAL_COMMUNITY)
Admission: RE | Disposition: A | Discharge status: Home / Self Care | Payer: Self-pay | Source: Ambulatory Visit | Attending: Gastroenterology

## 2016-04-26 ENCOUNTER — Encounter (HOSPITAL_COMMUNITY): Payer: Self-pay | Admitting: *Deleted

## 2016-04-26 ENCOUNTER — Ambulatory Visit (HOSPITAL_COMMUNITY)
Admission: RE | Admit: 2016-04-26 | Discharge: 2016-04-26 | Disposition: A | Discharge status: Home / Self Care | Payer: Self-pay | Source: Ambulatory Visit | Attending: Gastroenterology | Admitting: Gastroenterology

## 2016-04-26 DIAGNOSIS — Z1211 Encounter for screening for malignant neoplasm of colon: Secondary | ICD-10-CM

## 2016-04-26 DIAGNOSIS — N400 Enlarged prostate without lower urinary tract symptoms: Secondary | ICD-10-CM | POA: Insufficient documentation

## 2016-04-26 DIAGNOSIS — Q438 Other specified congenital malformations of intestine: Secondary | ICD-10-CM | POA: Insufficient documentation

## 2016-04-26 DIAGNOSIS — K5730 Diverticulosis of large intestine without perforation or abscess without bleeding: Secondary | ICD-10-CM | POA: Insufficient documentation

## 2016-04-26 DIAGNOSIS — K648 Other hemorrhoids: Secondary | ICD-10-CM

## 2016-04-26 DIAGNOSIS — Z1212 Encounter for screening for malignant neoplasm of rectum: Secondary | ICD-10-CM

## 2016-04-26 DIAGNOSIS — K562 Volvulus: Secondary | ICD-10-CM | POA: Insufficient documentation

## 2016-04-26 DIAGNOSIS — K644 Residual hemorrhoidal skin tags: Secondary | ICD-10-CM | POA: Insufficient documentation

## 2016-04-26 DIAGNOSIS — Z79899 Other long term (current) drug therapy: Secondary | ICD-10-CM | POA: Insufficient documentation

## 2016-04-26 DIAGNOSIS — Z87891 Personal history of nicotine dependence: Secondary | ICD-10-CM | POA: Insufficient documentation

## 2016-04-26 HISTORY — PX: COLONOSCOPY: SHX5424

## 2016-04-26 HISTORY — DX: Benign prostatic hyperplasia without lower urinary tract symptoms: N40.0

## 2016-04-26 SURGERY — COLONOSCOPY
Anesthesia: Moderate Sedation

## 2016-04-26 MED ORDER — MIDAZOLAM HCL 5 MG/5ML IJ SOLN
INTRAMUSCULAR | Status: DC | PRN
  Administered 2016-04-26 (×3): 2 mg via INTRAVENOUS

## 2016-04-26 MED ORDER — MEPERIDINE HCL 100 MG/ML IJ SOLN
INTRAMUSCULAR | Status: DC | PRN
  Administered 2016-04-26 (×3): 25 mg via INTRAVENOUS

## 2016-04-26 MED ORDER — MEPERIDINE HCL 100 MG/ML IJ SOLN
INTRAMUSCULAR | Status: AC
  Filled 2016-04-26: qty 2

## 2016-04-26 MED ORDER — OMEPRAZOLE 20 MG PO CPDR: DELAYED_RELEASE_CAPSULE | ORAL | 11 refills | Status: DC

## 2016-04-26 MED ORDER — STERILE WATER FOR IRRIGATION IR SOLN
Status: DC | PRN
  Administered 2016-04-26: 100 mL

## 2016-04-26 MED ORDER — MIDAZOLAM HCL 5 MG/5ML IJ SOLN
INTRAMUSCULAR | Status: AC
  Filled 2016-04-26: qty 10

## 2016-04-26 MED ORDER — SODIUM CHLORIDE 0.9 % IV SOLN
INTRAVENOUS | Status: DC
  Administered 2016-04-26: 12:00:00 via INTRAVENOUS

## 2016-04-26 MED FILL — ?OMEPRAZOLE DR 20 MG CAPSUL: 20 | 31 days supply | Qty: 31 | Fill #0

## 2016-04-26 NOTE — Op Note (Signed)
[facility] Patient Name: Troy Harrison Procedure Date: 04/26/2016 11:58 AM MRN: [id number] Date of Birth: 1961/04/30 Attending MD: Jonette EvaSandi Ulrich Soules , MD CSN: [id number] Age: [age] Admit Type: Outpatient Procedure:                Colonoscopym SCREENING Indications:              Screening for colorectal malignant neoplasm Providers:                Jonette EvaSandi Juanluis Guastella, MD, Edrick Kinsammy Vaught, RN, Birder Robsonebra Houghton,                            Technician Referring MD:             Pete Glatterawn T. Langeland Medicines:                Meperidine 75 mg IV, Midazolam 6 mg IV Complications:            No immediate complications. Estimated Blood Loss:     Estimated blood loss: none. Procedure:                Pre-Anesthesia Assessment:                           - Prior to the procedure, a History and Physical                            was performed, and patient medications and                            allergies were reviewed. The patient's tolerance of                            previous anesthesia was also reviewed. The risks                            and benefits of the procedure and the sedation                            options and risks were discussed with the patient.                            All questions were answered, and informed consent                            was obtained. Prior Anticoagulants: The patient has                            taken no previous anticoagulant or antiplatelet                            agents. ASA Grade Assessment: I - A normal, healthy                            patient. After reviewing the risks and benefits,  the patient was deemed in satisfactory condition to                            undergo the procedure. After obtaining informed                            consent, the colonoscope was passed under direct                            vision. Throughout the procedure, the patient's                            blood pressure, pulse, and  oxygen saturations were                            monitored continuously. The EC-[id number] ([id number])                            scope was introduced through the anus and advanced                            to the the cecum, identified by appendiceal orifice                            and ileocecal valve. The ileocecal valve,                            appendiceal orifice, and rectum were photographed.                            The colonoscopy was somewhat difficult due to a                            tortuous colon. Successful completion of the                            procedure was aided by straightening and shortening                            the scope to obtain bowel loop reduction and                            COLOWRAP. The patient tolerated the procedure                            fairly well. The quality of the bowel preparation                            was excellent. Scope In: 12:15:04 PM Scope Out: 12:28:03 PM Scope Withdrawal Time: 0 hours 8 minutes 28 seconds  Total Procedure Duration: 0 hours 12 minutes 59 seconds  Findings:      The digital rectal exam was normal.      A few small-mouthed diverticula were found in the hepatic flexure  and       cecum.      The recto-sigmoid colon and sigmoid colon were mildly redundant AND       COLOWRAP USED TO REACH CECUM.      External hemorrhoids were found. The hemorrhoids were small. Impression:               - MILD Diverticulosis in the RIGHT colon.                           - SLIGHTLY Redundant LEFT colon.                           - SMALL EXTERNAL hemorrhoids. Moderate Sedation:      Moderate (conscious) sedation was administered by the endoscopy nurse       and supervised by the endoscopist. The following parameters were       monitored: oxygen saturation, heart rate, blood pressure, and response       to care. Total physician intraservice time was 26 minutes. Recommendation:           - High fiber diet.                            - Continue present medications.                           - Repeat colonoscopy in 10 years for surveillance.                           - Patient has a contact number available for                            emergencies. The signs and symptoms of potential                            delayed complications were discussed with the                            patient. Return to normal activities tomorrow.                            Written discharge instructions were provided to the                            patient. Procedure Code(s):        --- Professional ---                           [phone], Colonoscopy, flexible; diagnostic, including                            collection of specimen(s) by brushing or washing,                            when performed (separate procedure)  16109, Moderate sedation services provided by the                            same physician or other qualified health care                            professional performing the diagnostic or                            therapeutic service that the sedation supports,                            requiring the presence of an independent trained                            observer to assist in the monitoring of the                            patient's level of consciousness and physiological                            status; initial 15 minutes of intraservice time,                            patient age 56 years or older                           [phone], Moderate sedation services; each additional                            15 minutes intraservice time Diagnosis Code(s):        --- Professional ---                           Z12.11, Encounter for screening for malignant                            neoplasm of colon                           K64.8, Other hemorrhoids                           K57.30, Diverticulosis of large intestine without                            perforation or abscess without  bleeding                           Q43.8, Other specified congenital malformations of                            intestine CPT copyright 2016 American Medical Association. All rights reserved. The codes documented in this report are preliminary and upon coder review may  be revised to meet current compliance requirements. Jonette Eva, MD Duncan Dull  Sadey Yandell, MD 04/26/2016 12:52:00 PM This report has been signed electronically. Number of Addenda: 0

## 2016-04-26 NOTE — H&P (Addendum)
  Primary Care Physician:  [name], MD Primary Gastroenterologist:  Dr. Darrick PennaFields  Pre-Procedure History & Physical: HPI:  Troy Harrison is a 55 y.o. male here for COLON CANCER SCREENING. History/CONSENT obtained viA interpreter(Mark Norberto SorensonWashburn).  Past Medical History:  Diagnosis Date  . Enlarged prostate     History reviewed. No pertinent surgical history.  Prior to Admission medications   Medication Sig Start Date End Date Taking? Authorizing Provider  Multiple Vitamin (MULTIVITAMIN WITH MINERALS) TABS Take 1 tablet by mouth daily.   Yes Historical Provider, MD  naproxen sodium (FLANAX PAIN RELIEF) 220 MG tablet Take 220 mg by mouth 2 (two) times daily with a meal.   Yes Historical Provider, MD  tamsulosin (FLOMAX) 0.4 MG CAPS capsule Take 1 capsule (0.4 mg total) by mouth daily. 03/03/16  Yes [name], MD  meclizine (ANTIVERT) 25 MG tablet Take 1 tablet (25 mg total) by mouth 3 (three) times daily as needed for dizziness. Patient not taking: Reported on 04/26/2016 02/03/16   [name], MD    Allergies as of 04/06/2016  . (No Known Allergies)    Family History  Problem Relation Age of Onset  . Colon cancer Neg Hx     Social History   Social History  . Marital status: Married    Spouse name: N/A  . Number of children: N/A  . Years of education: N/A   Occupational History  . Not on file.   Social History Main Topics  . Smoking status: Former Smoker    Quit date: 04/26/1989  . Smokeless tobacco: Never Used  . Alcohol use No  . Drug use: No  . Sexual activity: Yes   Other Topics Concern  . Not on file   Social History Narrative  . No narrative on file    Review of Systems: See HPI, otherwise negative ROS   Physical Exam: There were no vitals taken for this visit. General:   Alert,  pleasant and cooperative in NAD Head:  Normocephalic and atraumatic. Neck:  Supple; Lungs:  Clear throughout to auscultation.    Heart:  Regular rate  and rhythm. Abdomen:  Soft, nontender and nondistended. Normal bowel sounds, without guarding, and without rebound.   Neurologic:  Alert and  oriented x4;  grossly normal neurologically.  Impression/Plan:     SCREENING  Plan:  1. TCS TODAY. DISCUSSED PROCEDURE, BENEFITS, & RISKS: < 1% chance of medication reaction, bleeding, perforation, or rupture of spleen/liver.

## 2016-04-26 NOTE — Discharge Instructions (Signed)
You have small external hemorrhoids and diverticulosis IN YOUR RIGHT COLON. YOU DID NOT HAVE ANY POLYPS.    TAKE OMEPRAZOLE every morning.    FOLLOW A HIGH FIBER DIET. AVOID ITEMS THAT CAUSE BLOATING. SEE INFO BELOW.  OUTPATIENT VISIT IN 4 WEEKS E30 EPIGASTRIC PAIN.  Thursday December 7th at 1:30  Next colonoscopy in 10 years.    Colonoscopa: cuidados posteriores (Colonoscopy, Care After) Siga estas instrucciones durante las prximas semanas. Estas indicaciones le proporcionan informacin general acerca de cmo deber cuidarse despus del procedimiento. El mdico tambin podr darle instrucciones ms especficas. El tratamiento ha sido planificado segn las prcticas mdicas actuales, pero en algunos casos pueden ocurrir problemas. Comunquese con el mdico si tiene algn problema o tiene dudas despus del procedimiento. QU ESPERAR DESPUS DEL PROCEDIMIENTO  Despus del procedimiento, es comn tener las siguientes sensaciones:  Una pequea cantidad de sangre en la materia fecal.  Cantidades moderadas de gases e hinchazn o calambres abdominales leves. INSTRUCCIONES PARA EL CUIDADO EN EL HOGAR  No conduzca vehculos, opere maquinarias ni firme documentos importantes durante 24horas.  Puede ducharse y retomar sus actividades fsicas habituales, pero muvase a un ritmo ms lento durante las primeras 24horas.  Tmese descansos frecuentes durante las primeras 24horas.  Camine o colquese compresas calientes en el abdomen para ayudar a reducir los calambres e hinchazn abdominales.  Beba suficiente lquido para Photographermantener la orina clara o de color amarillo plido.  Puede retomar su dieta normal segn las instrucciones de su mdico. Evite los alimentos pesados o fritos que son difciles de Location managerdigerir.  Evite consumir alcohol durante 24horas o segn las instrucciones de su mdico.  Tome solo medicamentos de venta libre o recetados, segn las indicaciones del mdico.  Si se obtuvo  una muestra de tejido (biopsia) durante el procedimiento:  No tome aspirina ni anticoagulantes durante 7das, o segn las instrucciones de su mdico.  No consuma alcohol durante 7das o segn las instrucciones de su mdico.  Consuma alimentos livianos durante las primeras 24horas. SOLICITE ATENCIN MDICA SI: Tiene manchas persistentes de sangre en la materia fecal entre 2 y 3das posteriores al procedimiento. SOLICITE ATENCIN MDICA DE INMEDIATO SI:  Tiene ms que una pequea mancha de sangre en la materia fecal.  Elimina grandes cogulos de sangre en la materia fecal.  Tiene el abdomen hinchado (distendido).  Tiene nuseas o vmitos.  Tiene fiebre.  Siente dolor intenso en el abdomen que no se alivia con los United Parcelmedicamentos.  Dieta rica en fibra  (High-Fiber Diet)  La fibra, tambin llamada fibra dietaria, es un tipo de carbohidrato que se encuentra en las frutas, las verduras, los cereales integrales y los frijoles. Una dieta rica en fibra puede tener muchos beneficios para la salud. El mdico puede recomendar una dieta rica en fibra para ayudar a:  Chief Strategy Officervitar el estreimiento. La fibra puede hacer que defeque con ms frecuencia.  Disminuir el nivel de colesterol.  Aliviar las hemorroides, la diverticulosis no complicada o el sndrome del intestino irritable.  Evitar comer en exceso como parte de un plan para bajar de peso.  Evitar cardiopatas, la diabetes tipo 2 y ciertos cnceres. EN QU CONSISTE EL PLAN?  El consumo diario recomendado de fibra incluye lo siguiente:  38 gramos para hombres menores de 50 aos.  30 gramos para hombres mayores de [place]50 aos.  25 gramos para mujeres menores de 50 aos.  21 gramos para mujeres mayores de [place]50 aos. Puede lograr el consumo diario recomendado de Slocombfibra si come una variedad de  frutas, verduras, cereales y frijoles. El mdico tambin puede recomendar un suplemento de fibra si no es posible obtener suficiente fibra a travs de la dieta.    QU DEBO SABER ACERCA DE LA DIETA RICA EN FIBRA?  La eficacia de los suplementos de Troy no ha sido estudiada Cecilia, de modo que es mejor obtener fibra a travs de los alimentos.  Verifique siempre el contenido de fibra en la etiqueta de informacin nutricional de los alimentos preenvasados. Busque alimentos que contengan al menos 5 gramos de fibra por porcin.  Consulte al nutricionista si tiene preguntas sobre algunos alimentos especficos relacionados con su enfermedad, especialmente si estos alimentos no se mencionan a continuacin.  Aumente el consumo diario de fibra en forma gradual. Aumentar demasiado rpido el consumo de fibra dietaria puede provocar meteorismo, clicos o gases.  Beber abundante agua. El [place] a Geophysicist/field seismologist. QU ALIMENTOS PUEDO COMER?  Cereales  Panes integrales. Multicereales. Avena. Arroz integral. Gypsy Decant. Trigo burgol. Mijo. Muffins de salvado. Palomitas de maz. Galletas de centeno.  Verduras  Batatas. Espinaca. Col rizada. Alcachofas. Repollo. Brcoli. Guisantes. Zanahorias. [place].  Frutas  Frutos rojos. Peras. Manzanas. Naranjas Agu[place]ates. Ciruelas y pasas. Higos secos.  Carnes y otras fuentes de protenas  Frijoles blancos, colorados, pintos y porotos de soja. Guisantes secos. Lentejas. Frutos secos y semillas.  Lcteos  Yogur fortificado con Research scientist (life sciences).  Bebidas  Leche de soja fortificada con Bjorn Loser. Jugo de naranja fortificado con Bjorn Loser.  Otros  Barras de [place].  Los artculos mencionados arriba pueden no ser [employer] de las bebidas o los alimentos recomendados. Comunquese con el nutricionista para conocer ms opciones.  QU ALIMENTOS NO SE RECOMIENDAN?  Cereales  Pan blanco. Pastas hechas con Webb Laws. Arroz blanco.  Verduras  Papas fritas. Verduras enlatadas. Verduras bien cocidas.  Frutas  Jugo de frutas. Frutas cocidas coladas.  Carnes y [address] fuentes de protenas  Cortes de carne con Holiday representative. Aves o pescados  fritos.  Lcteos  Leche. Yogur. Queso crema. [employer].  Bebidas  Gaseosas.  Otros  Tortas y pasteles. Mantequilla y aceites.  Los artculos mencionados arriba pueden no ser [employer] de las bebidas y los alimentos que se Theatre stage manager. Comunquese con el nutricionista para obtener ms informacin.  ALGUNOS CONSEJOS PARA INCLUIR ALIMENTOS RICOS EN FIBRA EN LA DIETA  Consuma una gran variedad de alimentos ricos en fibra.  Asegrese de que la mitad de todos los cereales consumidos cada da sean cereales integrales.  Reemplace los panes y cereales hechos de harina refinada o harina blanca por panes y cereales integrales.  Reemplace el arroz blanco por arroz integral, trigo burgol o mijo.  Comience Medical laboratory scientific officer con un desayuno rico en [place], como un cereal que contenga al menos 5 gramos de fibra por porcin.  Use guisantes en lugar de carne en las sopas, ensaladas o pastas.  Coma bocadillos ricos en fibra, como frutos rojos, verduras crudas, frutos secos o palomitas de maz.   Diverticulosis  (Diverticulosis)   La diverticulosis es una enfermedad que aparece cuando se forman pequeos bolsillos (divertculos) en las paredes del colon. El colon, o intestino grueso, es el lugar donde se absorbe agua y se forman las heces. Los bolsillos se forman cuando la capa interna del colon ejerce presin sobre los puntos dbiles de las capas externas.  CAUSAS  Nadie sabe con exactitud qu causa la diverticulosis.  FACTORES DE RIESGO  Ser mayor de 50 aos. El riesgo de desarrollar esta enfermedad aumenta con  la edad. La diverticulosis es poco frecuente en las personas menores de 40 aos. A los 80 aos, casi todas las personas tienen la enfermedad.  Comer una dieta con bajo contenido de [place] Estatesfibra.  Estar estreido con frecuencia.  Tener sobrepeso.  No hacer suficiente ejercicio fsico.  Fumar.  Tomar analgsicos de [address] libre, como aspirina e ibuprofeno. SNTOMAS  La mayora de las personas que tienen  diverticulosis no presentan sntomas.  DIAGNSTICO  Dado que la diverticulosis no suele causar sntomas, los mdicos a menudo descubren la enfermedad durante un examen de otros problemas de colon. En muchos casos, el mdico diagnosticar la diverticulosis mientras utiliza un endoscopio flexible para examinar el colon (colonoscopa).  TRATAMIENTO  Si nunca tuvo una infeccin relacionada con la diverticulosis, es posible que no necesite tratamiento. Si ha tenido una infeccin antes, el tratamiento puede incluir:  Comer ms frutas, verduras y cereales.  Tomar un suplemento de [place]fibra.  Tomar un suplemento de bacterias vivas (probitico).  Tomar medicamentos para relajar el colon. INSTRUCCIONES PARA EL CUIDADO EN EL HOGAR  Beba por lo menos entre 6 y 8 vasos de agua por da para Bankerprevenir el estreimiento.  Trate de no hacer fuerza al mover el intestino.  Cumpla con todas las visitas de control. Si ha tenido una infeccin antes:  Aumente la cantidad de fibra en la dieta, segn las indicaciones del mdico o del nutricionista.  Tome un suplemento dietario con fibras si el mdico lo autoriza.  Tome los medicamentos solamente como se lo haya indicado el mdico. SOLICITE ATENCIN MDICA SI:  Siente dolor abdominal.  Tiene meteorismo.  Tiene clicos.  No ha defecado en 3 das. SOLICITE ATENCIN MDICA DE INMEDIATO SI:  El dolor empeora.  El meteorismo The Timken Companyempeora mucho.  Tiene fiebre o escalofros, y los sntomas empeoran repentinamente.  Comienza a vomitar.  La materia fecal es sanguinolenta o negra. ASEGRESE DE QUE:  Comprende estas instrucciones.  Controlar su afeccin.  Recibir ayuda de inmediato si no mejora o si empeora.

## 2016-04-27 ENCOUNTER — Encounter (HOSPITAL_COMMUNITY): Payer: Self-pay | Admitting: Gastroenterology

## 2016-05-12 ENCOUNTER — Ambulatory Visit: Payer: Self-pay | Attending: Internal Medicine

## 2016-05-27 ENCOUNTER — Ambulatory Visit (INDEPENDENT_AMBULATORY_CARE_PROVIDER_SITE_OTHER): Payer: Self-pay | Admitting: Gastroenterology

## 2016-05-27 ENCOUNTER — Encounter: Payer: Self-pay | Admitting: Gastroenterology

## 2016-05-27 DIAGNOSIS — R350 Frequency of micturition: Secondary | ICD-10-CM

## 2016-05-27 DIAGNOSIS — R1032 Left lower quadrant pain: Secondary | ICD-10-CM

## 2016-05-27 DIAGNOSIS — K644 Residual hemorrhoidal skin tags: Secondary | ICD-10-CM

## 2016-05-27 DIAGNOSIS — K5901 Slow transit constipation: Secondary | ICD-10-CM

## 2016-05-27 MED ORDER — TAMSULOSIN HCL 0.4 MG PO CAPS: 0.4000 mg | ORAL_CAPSULE | Freq: Every day | ORAL | 3 refills | Status: DC

## 2016-05-27 NOTE — Assessment & Plan Note (Signed)
SYMPTOMS CONTROLLED/RESOLVED.  CONTINUE TO MONITOR SYMPTOMS. 

## 2016-05-27 NOTE — Assessment & Plan Note (Signed)
DUE TO PROSTATE HYPERTROPHY/URINARY RETENTION.  CONTINUE FLOMAX.

## 2016-05-27 NOTE — Assessment & Plan Note (Signed)
DUE TO URINARY RETENTION/PROSTTE HYPERTROPHY.  RE-START FLOMAX. REFILL X 1 YEAR SEE OPV IN SYMPTOMS NOT IDEALLY CONTROLLED.

## 2016-05-27 NOTE — Progress Notes (Signed)
   Subjective:    Patient ID: Troy Harrison, male    DOB: [birth year], 55 y.o.   MRN: [id number] Troy Glatterawn T Langeland, MD   INTERPRETER: Colonel BaldANGEL GUERRO PT IS SPANISH SPEAKING ONLY.  HPI With PCP THOUGHT PAIN WAS UPPER ABDOMINAL PAIN. HAVING TROUBLE WITH BLADDER AND UP AT NIGHT 4-5 TIMES A NIGHT TO URINATE. STOPPED TAKING HIS FLOMAX BECAUSE HE DID NOT UNDERSTAND HE SHOULD CONTINUE IT AND HAD REFILLS. SOME HOT FEELINGS IN UPPER BACK. PAIN IN HIS WRISTS AND BORDERLINE DEPRESSION. BMs: 2X/DAY-NL. NO SLEEP APNEA. SOMETIMES MAKES A BIG NOISE ACROSS TOP OF HIS ABDOMEN. NAUSEA: ONE TIME BUT NOT NOW. TAKING OMEPRAZOLE EVERY MORNING.  PT DENIES FEVER, CHILLS, HEMATOCHEZIA, HEMATEMESIS, nausea, vomiting, melena, diarrhea, CHEST PAIN, SHORTNESS OF BREATH,  CHANGE IN BOWEL IN HABITS, constipation, problems swallowing, problems with sedation, OR heartburn or indigestion.   Past Medical History:  Diagnosis Date  . Enlarged prostate    Past Surgical History:  Procedure Laterality Date  . COLONOSCOPY N/A 04/26/2016   Procedure: COLONOSCOPY;  Surgeon: West BaliSandi L Meloney Feld, MD;  Location: [facility];  Service: Endoscopy;  Laterality: N/A;  1200   No Known Allergies  Current Outpatient Prescriptions  Medication Sig Dispense Refill  . omeprazole (PRILOSEC) 20 MG capsule 1 PO EVERY MORNING    . meclizine (ANTIVERT) 25 MG tablet Take 1 tablet (25 mg total) by mouth 3 (three) times daily as needed for dizziness. (Patient not taking: Reported on 05/27/2016)    . Multiple Vitamin (MULTIVITAMIN WITH MINERALS) TABS Take 1 tablet by mouth daily.    . naproxen sodium (FLANAX PAIN RELIEF) 220 MG tablet Take 220 mg by mouth 2 (two) times daily with a meal.    .      Review of Systems PER HPI OTHERWISE ALL SYSTEMS ARE NEGATIVE.     Objective:   Physical Exam  Constitutional: He is oriented to person, place, and time. He appears well-developed and well-nourished. No distress.  HENT:  Head: Normocephalic and  atraumatic.  Mouth/Throat: Oropharynx is clear and moist. No oropharyngeal exudate.  Eyes: Pupils are equal, round, and reactive to light. No scleral icterus.  Neck: Normal range of motion. Neck supple.  Cardiovascular: Normal rate, regular rhythm and normal heart sounds.   Pulmonary/Chest: Effort normal and breath sounds normal. No respiratory distress.  Abdominal: Soft. Bowel sounds are normal. He exhibits no distension. There is tenderness. There is no rebound and no guarding.  MILD SUPRAPUBIC AND LLQ TENDERNESS   Musculoskeletal: He exhibits no edema.  Lymphadenopathy:    He has no cervical adenopathy.  Neurological: He is alert and oriented to person, place, and time.  NO FOCAL DEFICITS  Psychiatric: He has a normal mood and affect.  Vitals reviewed.     Assessment & Plan:

## 2016-05-27 NOTE — Patient Instructions (Addendum)
CONTINUAR TAMSULOSIN  BEBER AQUA.  COMA LA FIBRA.  SEGUIMIENTO EN 6-12 MESES    Dieta rica en fibra  (High-Fiber Diet)  La fibra, tambin llamada fibra dietaria, es un tipo de carbohidrato que se encuentra en las frutas, las verduras, los cereales integrales y los frijoles. Una dieta rica en fibra puede tener muchos beneficios para la salud. El mdico puede recomendar una dieta rica en fibra para ayudar a:  Chief Strategy Officervitar el estreimiento. La fibra puede hacer que defeque con ms frecuencia.  Disminuir el nivel de colesterol.  Aliviar las hemorroides, la diverticulosis no complicada o el sndrome del intestino irritable.  Evitar comer en exceso como parte de un plan para bajar de peso.  Evitar cardiopatas, la diabetes tipo 2 y ciertos cnceres. EN QU CONSISTE EL PLAN?  El consumo diario recomendado de fibra incluye lo siguiente:  38 gramos para hombres menores de 50 aos.  30 gramos para hombres mayores de [place]50 aos.  25 gramos para mujeres menores de 50 aos.  21 gramos para mujeres mayores de [place]50 aos. Puede lograr el consumo diario recomendado de fibra si come una variedad de frutas, verduras, cereales y frijoles. El mdico tambin puede recomendar un suplemento de fibra si no es posible obtener suficiente fibra a travs de la dieta.  QU DEBO SABER ACERCA DE LA DIETA RICA EN FIBRA?  La eficacia de los suplementos de Gannfibra no ha sido estudiada Ocontoampliamente, de modo que es mejor obtener fibra a travs de los alimentos.  Verifique siempre el contenido de fibra en la etiqueta de informacin nutricional de los alimentos preenvasados. Busque alimentos que contengan al menos 5 gramos de fibra por porcin.  Consulte al nutricionista si tiene preguntas sobre algunos alimentos especficos relacionados con su enfermedad, especialmente si estos alimentos no se mencionan a continuacin.  Aumente el consumo diario de fibra en forma gradual. Aumentar demasiado rpido el consumo de fibra dietaria puede  provocar meteorismo, clicos o gases.  Beber abundante agua. El [place] ayuda a Geophysicist/field seismologistdigerir la fibra. QU ALIMENTOS PUEDO COMER?  Cereales  Panes integrales. Multicereales. Avena. Arroz integral. Gypsy Decantebada. Trigo burgol. Mijo. Muffins de salvado. Palomitas de maz. Galletas de centeno.  Verduras  Batatas. Espinaca. Col rizada. Alcachofas. Repollo. Brcoli. Guisantes. Zanahorias. [place].  Frutas  Frutos rojos. Peras. Manzanas. Naranjas Aguacates. Ciruelas y pasas. Higos secos.  Carnes y otras fuentes de protenas  Frijoles blancos, colorados, pintos y porotos de soja. Guisantes secos. Lentejas. Frutos secos y semillas.  Lcteos  Yogur fortificado con Research scientist (life sciences)fibra.  Bebidas  Leche de soja fortificada con Bjorn Loserfibra. Jugo de naranja fortificado con Bjorn Loserfibra.  Otros  Barras de [place]fibra.  Los artculos mencionados arriba pueden no ser Raytheonuna lista completa de las bebidas o los alimentos recomendados. Comunquese con el nutricionista para conocer ms opciones.  QU ALIMENTOS NO SE RECOMIENDAN?  Cereales  Pan blanco. Pastas hechas con Webb Lawsharina refinada. Arroz blanco.  Verduras  Papas fritas. Verduras enlatadas. Verduras bien cocidas.  Frutas  Jugo de frutas. Frutas cocidas coladas.  Carnes y [address]otras fuentes de protenas  Cortes de carne con Holiday representativegrasa. Aves o pescados fritos.  Lcteos  Leche. Yogur. Queso crema. PPG IndustriesCrema cida.  Bebidas  Gaseosas.  Otros  Tortas y pasteles. Mantequilla y aceites.  Los artculos mencionados arriba pueden no ser Raytheonuna lista completa de las bebidas y los alimentos que se Theatre stage managerdeben evitar. Comunquese con el nutricionista para obtener ms informacin.  ALGUNOS CONSEJOS PARA INCLUIR ALIMENTOS RICOS EN FIBRA EN LA DIETA  Consuma una gran variedad de alimentos  ricos en fibra.  Asegrese de que la mitad de todos los cereales consumidos cada da sean cereales integrales.  Reemplace los panes y cereales hechos de harina refinada o harina blanca por panes y cereales integrales.  Reemplace el arroz blanco  por arroz integral, trigo burgol o mijo.  Com[facility] Medical laboratory scientific officerel da con un desayuno rico en [place]fibra, como un cereal que contenga al menos 5 gramos de fibra por porcin.  Use guisantes en lugar de carne en las sopas, ensaladas o pastas.  Coma bocadillos ricos en fibra, como frutos rojos, verduras crudas, frutos secos o palomitas de maz.

## 2016-05-27 NOTE — Assessment & Plan Note (Signed)
SYMPTOMS CONTROLLED/RESOLVED.  DRINK WATER EAT FIBER OPV IN 6-12 MOS

## 2016-05-31 NOTE — Progress Notes (Signed)
ON RECALL  °

## 2016-05-31 NOTE — Progress Notes (Signed)
CC'D TO PCP °

## 2016-06-09 MED FILL — OMEPRAZOLE DR 20 MG CAPSULE: 20 | 31 days supply | Qty: 31 | Fill #1

## 2016-07-23 ENCOUNTER — Ambulatory Visit: Payer: Self-pay

## 2016-09-10 ENCOUNTER — Ambulatory Visit: Payer: Self-pay | Attending: Internal Medicine

## 2016-10-21 ENCOUNTER — Encounter: Payer: Self-pay | Admitting: Gastroenterology

## 2016-11-01 ENCOUNTER — Encounter (HOSPITAL_COMMUNITY): Payer: Self-pay | Admitting: Emergency Medicine

## 2016-11-01 ENCOUNTER — Ambulatory Visit (HOSPITAL_COMMUNITY)
Admission: EM | Admit: 2016-11-01 | Discharge: 2016-11-01 | Disposition: A | Discharge status: Home / Self Care | Payer: Self-pay | Attending: Internal Medicine | Admitting: Internal Medicine

## 2016-11-01 DIAGNOSIS — H00025 Hordeolum internum left lower eyelid: Secondary | ICD-10-CM

## 2016-11-01 DIAGNOSIS — H1089 Other conjunctivitis: Secondary | ICD-10-CM

## 2016-11-01 MED ORDER — POLYMYXIN B-TRIMETHOPRIM 10000-0.1 UNIT/ML-% OP SOLN: 1.0000 [drp] | OPHTHALMIC | 0 refills | Status: DC

## 2016-11-01 MED ORDER — KETOTIFEN FUMARATE 0.025 % OP SOLN: 1.0000 [drp] | Freq: Two times a day (BID) | OPHTHALMIC | 0 refills | Status: DC

## 2016-11-01 MED FILL — POLYMYXIN B/TMP EYE DROPS: 10000-0.1 | 25 days supply | Qty: 10 | Fill #0

## 2016-11-01 NOTE — Discharge Instructions (Signed)
Use the antibiotic eyedrops as directed. Also obtained Zaditor eyedrops using 1 drop in the left eye twice a day. Apply warm compresses to the left eye 3-4 times a day. If not getting better within 2-3 days follow-up with the ophthalmologist listed on this page.

## 2016-11-01 NOTE — ED Triage Notes (Signed)
Left eye problem for 3 weeks, there is a bump, pain, redness

## 2016-11-01 NOTE — ED Notes (Signed)
Maggy. Patient Access Director assisted with discharge

## 2016-11-01 NOTE — ED Provider Notes (Signed)
CSN: [id number]     Arrival date & time 11/01/16  1233 History   First MD Initiated Contact with Patient 11/01/16 1507     Chief Complaint  Patient presents with  . Eye Problem   (Consider location/radiation/quality/duration/timing/severity/associated sxs/prior Treatment) 56 year old Hispanic male states he has had a left eye irritation for 2-3 weeks. He states for the past 20 days he has noticed a small bump in the lower eyelid. He states it sometimes feels like it scratching them. The eye has turned red in the past few days. Vision is unchanged. No known trauma. No foreign body sensation. He states he has been rubbing his eye and believes it may have been making it worse.      Past Medical History:  Diagnosis Date  . Enlarged prostate    Past Surgical History:  Procedure Laterality Date  . COLONOSCOPY N/A 04/26/2016   Procedure: COLONOSCOPY;  Surgeon: West BaliSandi L Fields, MD;  Location: [facility];  Service: Endoscopy;  Laterality: N/A;  1200   Family History  Problem Relation Age of Onset  . Colon cancer Neg Hx    Social History  Substance Use Topics  . Smoking status: Former Smoker    Quit date: 04/26/1989  . Smokeless tobacco: Never Used  . Alcohol use No    Review of Systems  Constitutional: Negative.   HENT: Negative.   Eyes: Positive for pain and redness. Negative for photophobia, discharge and visual disturbance.  All other systems reviewed and are negative.   Allergies  Patient has no known allergies.  Home Medications   Prior to Admission medications   Medication Sig Start Date End Date Taking? Authorizing Provider  ketotifen (ZADITOR) 0.025 % ophthalmic solution Place 1 drop into the left eye 2 (two) times daily. OTC acceptable 11/01/16   Hayden RasmussenMabe, Joshalyn Ancheta, NP  Multiple Vitamin (MULTIVITAMIN WITH MINERALS) TABS Take 1 tablet by mouth daily.    [provider]  naproxen sodium (FLANAX PAIN RELIEF) 220 MG tablet Take 220 mg by mouth 2 (two) times daily with  a meal.    [provider]  omeprazole (PRILOSEC) 20 MG capsule 1 PO EVERY MORNING 04/26/16   Fields, Sandi L, MD  tamsulosin (FLOMAX) 0.4 MG CAPS capsule Take 1 capsule (0.4 mg total) by mouth daily. 05/27/16   Fields, Darleene CleaverSandi L, MD  trimethoprim-polymyxin b (POLYTRIM) ophthalmic solution Place 1 drop into the left eye every 4 (four) hours. 11/01/16   Hayden RasmussenMabe, [name], NP   Meds Ordered and Administered this Visit  Medications - No data to display  BP 128/61 (BP Location: Right Arm)   Pulse 88   Temp 98.4 F (36.9 C) (Oral)   Resp 14   SpO2 100%  No data found.   Physical Exam  Constitutional: He appears well-developed and well-nourished. No distress.  HENT:  Head: Normocephalic and atraumatic.  Eyes: EOM are normal. Pupils are equal, round, and reactive to light.  Left eye with lower lid erythema and mild swelling. There is a small red papular type lesion similar to a small internal hordeolum. No current drainage. The sclera is white. Not injected. No injection or erythema of the upper lid conjunctiva.  Neck: Neck supple.  Pulmonary/Chest: Effort normal.  Neurological: He is alert.  Skin: Skin is warm and dry.  Nursing note and vitals reviewed.   Urgent Care Course     Procedures (including critical care time)  Labs Review Labs Reviewed - No data to display  Imaging Review No results found.  Visual Acuity Review  Right Eye Distance:   Left Eye Distance:   Bilateral Distance:    Right Eye Near:   Left Eye Near:    Bilateral Near:         MDM   1. Other conjunctivitis of left eye   2. Hordeolum internum of left lower eyelid    Use the antibiotic eyedrops as directed. Also obtained Zaditor eyedrops using 1 drop in the left eye twice a day. Apply warm compresses to the left eye 3-4 times a day. If not getting better within 2-3 days follow-up with the ophthalmologist listed on this page. Meds ordered this encounter  Medications  . trimethoprim-polymyxin b  (POLYTRIM) ophthalmic solution    Sig: Place 1 drop into the left eye every 4 (four) hours.    Dispense:  10 mL    Refill:  0    Order Specific Question:   Supervising Provider    Answer:   Eustace Moore [[id number]]  . ketotifen (ZADITOR) 0.025 % ophthalmic solution    Sig: Place 1 drop into the left eye 2 (two) times daily. OTC acceptable    Dispense:  5 mL    Refill:  0    Order Specific Question:   Supervising Provider    Answer:   Eustace Moore [[id number]]       Hayden Rasmussen, NP 11/01/16 [phone]

## 2016-11-04 ENCOUNTER — Encounter: Payer: Self-pay | Admitting: Internal Medicine

## 2016-11-05 ENCOUNTER — Encounter: Payer: Self-pay | Admitting: Internal Medicine

## 2016-11-08 ENCOUNTER — Other Ambulatory Visit: Payer: Self-pay | Admitting: Internal Medicine

## 2016-11-08 ENCOUNTER — Ambulatory Visit: Payer: Self-pay | Attending: Internal Medicine | Admitting: Internal Medicine

## 2016-11-08 ENCOUNTER — Ambulatory Visit (HOSPITAL_COMMUNITY)
Admission: RE | Admit: 2016-11-08 | Discharge: 2016-11-08 | Disposition: A | Discharge status: Home / Self Care | Payer: Self-pay | Source: Ambulatory Visit | Attending: Internal Medicine | Admitting: Internal Medicine

## 2016-11-08 ENCOUNTER — Encounter: Payer: Self-pay | Admitting: Internal Medicine

## 2016-11-08 VITALS — BP 149/78 | HR 72 | Temp 98.0°F | Resp 16 | Wt 162.0 lb

## 2016-11-08 DIAGNOSIS — N433 Hydrocele, unspecified: Secondary | ICD-10-CM | POA: Insufficient documentation

## 2016-11-08 DIAGNOSIS — R350 Frequency of micturition: Secondary | ICD-10-CM | POA: Insufficient documentation

## 2016-11-08 DIAGNOSIS — N50819 Testicular pain, unspecified: Secondary | ICD-10-CM | POA: Insufficient documentation

## 2016-11-08 DIAGNOSIS — I861 Scrotal varices: Secondary | ICD-10-CM | POA: Insufficient documentation

## 2016-11-08 DIAGNOSIS — Z1329 Encounter for screening for other suspected endocrine disorder: Secondary | ICD-10-CM

## 2016-11-08 DIAGNOSIS — E559 Vitamin D deficiency, unspecified: Secondary | ICD-10-CM | POA: Insufficient documentation

## 2016-11-08 DIAGNOSIS — N5082 Scrotal pain: Secondary | ICD-10-CM | POA: Insufficient documentation

## 2016-11-08 DIAGNOSIS — Z131 Encounter for screening for diabetes mellitus: Secondary | ICD-10-CM | POA: Insufficient documentation

## 2016-11-08 DIAGNOSIS — I10 Essential (primary) hypertension: Secondary | ICD-10-CM | POA: Insufficient documentation

## 2016-11-08 DIAGNOSIS — N401 Enlarged prostate with lower urinary tract symptoms: Secondary | ICD-10-CM | POA: Insufficient documentation

## 2016-11-08 LAB — POCT GLYCOSYLATED HEMOGLOBIN (HGB A1C): HEMOGLOBIN A1C: 5.5

## 2016-11-08 MED ORDER — HYDROCHLOROTHIAZIDE 25 MG PO TABS: 12.5000 mg | ORAL_TABLET | ORAL | 3 refills | Status: DC

## 2016-11-08 MED ORDER — TAMSULOSIN HCL 0.4 MG PO CAPS: 0.4000 mg | ORAL_CAPSULE | Freq: Every day | ORAL | 3 refills | Status: DC

## 2016-11-08 MED FILL — TAMSULOSIN HCL 0.4 MG CAP: 0.4 | 30 days supply | Qty: 30 | Fill #0

## 2016-11-08 MED FILL — HYDROCHLOROTHIAZIDE 25 MG T: 25 | 30 days supply | Qty: 30 | Fill #0

## 2016-11-08 NOTE — Progress Notes (Signed)
Troy Harrison, is a 56 y.o. male  VWU:[id number]  [id number]  DOB - [birth year]  Chief Complaint  Patient presents with  . Testicle Pain        Subjective:   Troy Harrison is a 57 y.o. male here today for a follow up visit,  Last seen 03/03/16, w/ hx of borderline elevated bp, bph w/ urinary frequency.  His bph sx resolved after flomax daily, he subsequently stopped his flomax about 71month ago and now symptoms recurred.  Also c/o of bilat scrotal pain/discomfort x 2 wks now, mild discomfort w/ urination but denies hematuria/dysuria. He states sexual intercourse painful at times lately.   Patient has No headache, No chest pain, No abdominal pain - No Nausea, No new weakness tingling or numbness, No Cough - SOB.  Interpreter was used to communicate directly with patient for the entire encounter including providing detailed patient instructions.   Problem  Htn (Hypertension), Benign    ALLERGIES: No Known Allergies  PAST MEDICAL HISTORY: Past Medical History:  Diagnosis Date  . Enlarged prostate     MEDICATIONS AT HOME: Prior to Admission medications   Medication Sig Start Date End Date Taking? Authorizing Provider  hydrochlorothiazide (HYDRODIURIL) 25 MG tablet Take 0.5 tablets (12.5 mg total) by mouth every morning. 11/08/16   Timm Bonenberger T, MD  ketotifen (ZADITOR) 0.025 % ophthalmic solution Place 1 drop into the left eye 2 (two) times daily. OTC acceptable 11/01/16   Hayden Rasmussen, NP  Multiple Vitamin (MULTIVITAMIN WITH MINERALS) TABS Take 1 tablet by mouth daily.    [provider]  naproxen sodium (FLANAX PAIN RELIEF) 220 MG tablet Take 220 mg by mouth 2 (two) times daily with a meal.    [provider]  omeprazole (PRILOSEC) 20 MG capsule 1 PO EVERY MORNING 04/26/16   Fields, Sandi L, MD  tamsulosin (FLOMAX) 0.4 MG CAPS capsule Take 1 capsule (0.4 mg total) by mouth daily. Can take bid x 2 wks, than go back to daily. 11/08/16    Pete Glatter, MD  trimethoprim-polymyxin b (POLYTRIM) ophthalmic solution Place 1 drop into the left eye every 4 (four) hours. 11/01/16   Hayden Rasmussen, NP     Objective:   Vitals:   11/08/16 1031  BP: (!) 149/78  Pulse: 72  Resp: 16  Temp: 98 F (36.7 C)  TempSrc: Oral  SpO2: 100%  Weight: 162 lb (73.5 kg)    Exam General appearance : Awake, alert, not in any distress. Speech Clear. Not toxic looking, thin appearing male, pleasant. HEENT: Atraumatic and Normocephalic, pupils equally reactive to light. Neck: supple, no JVD.  Chest:Good air entry bilaterally, no added sounds. CVS: S1 S2 regular, no murmurs/gallups or rubs. Abdomen: Bowel sounds active, Non tender and not distended with no gaurding, rigidity or rebound. gu exam: no scrotal edema noted, no palpable masses noted, nttp on exam, no signs of torsion noted, neg exam for bilat inguinal hernias, no erythema noted Extremities: B/L Lower Ext shows no edema, both legs are warm to touch Neurology: Awake alert, and oriented X 3, CN II-XII grossly intact, Non focal Skin:No Rash  Data Review Lab Results  Component Value Date   HGBA1C 5.5 11/08/2016    Depression screen [facility] 2/9 11/08/2016 03/14/2014 12/11/2013  Decreased Interest 1 1 0  Down, Depressed, Hopeless 1 0 0  PHQ - 2 Score 2 1 0  Altered sleeping 1 - -  Tired, decreased energy 1 - -  Change in appetite 1 - -  Feeling bad or failure about yourself  0 - -  Trouble concentrating 1 - -  Moving slowly or fidgety/restless 0 - -  Suicidal thoughts 0 - -  PHQ-9 Score 6 - -      Assessment & Plan   1. HTN (hypertension), benign - low salt diet recd, he was elavated in Sept when last saw, but actually nml on recent ED visit on 11/01/16 - start hctz 12.5 qd for now, rechk in 1 month, given restarting flomax as well - Lipid Panel - CBC with Differential  2. Benign prostatic hyperplasia with urinary frequency - restarted flomax 0.4mg  qd, take bid x2 wks, than  go back to daily dosing. - PSA  3. Diabetes mellitus screening - HgB A1c 5.5  4. Vitamin D deficiency - VITAMIN D 25 Hydroxy (Vit-D Deficiency, Fractures)  5. Thyroid disorder screen - TSH  6. Testicular pain, unspecified Etiology unclear, will chk urine labs to r/o infection, scheduled for US today as well, cycts perhaps? No signs of epidermitis on exam - of note, instructed pt that he develops acute testicular pain that does not resolve quickly, needs to go to ER to r/o testicular torsion. - Urine cytology ancillary only - US Art/Ven Flow Abd Pelv Doppler; Future - US Scrotum; Future     Patient have been counseled extensively about nutrition and exercise  Return in about 4 weeks (around 12/06/2016) for htn / bph /bilat testicular pain.  The patient was given clear instructions to go to ER or return to medical center if symptoms don't improve, worsen or new problems develop. The patient verbalized understanding. The patient was told to call to get lab results if they haven't heard anything in the next week.   This note has been created with Education officer, environmentalDragon speech recognition software and smart phrase technology. Any transcriptional errors are unintentional.   Pete Glatterawn T Javaria Knapke, MD, MBA/MHA [facility] [place], [place] [phone]   11/08/2016, 11:17 AM

## 2016-11-08 NOTE — Patient Instructions (Addendum)
Hiperplasia prosttica benigna (Benign Prostatic Hyperplasia) Troy Harrison prstata agrandada (hiperplasia prosttica benigna) es frecuente en los hombres Troy Harrison. Puede experimentar los siguientes sntomas:  Chorro de orina dbil.  Goteo.  Sensacin de que la vejiga no se ha vaciado completamente.  Dificultad para comenzar a Troy Harrison.  Levantarse con frecuencia por la noche para orinar.  Orinar con ms frecuencia Troy Harrison. INSTRUCCIONES PARA EL CUIDADO EN EL HOGAR Controle su hiperplasia prosttica para ver si hay cambios. Las siguientes acciones ayudarn a Troy Harrison cualquier Troy Harrison pueda experimentar:  Tmese tiempo para Troy Harrison.  No beba alcohol.  Evite las bebidas que contengan cafena, como Troy Harrison, el t, las bebidas cola ya que pueden empeorar el problema.  Evite los descongestivos, antihistamnicos y algunos medicamentos recetados que pueden empeorar el problema.  Haga controles con su mdico para seguir el tratamiento, segn las indicaciones. SOLICITE ATENCIN MDICA SI:  Experimenta dificultad progresiva para el vaciamiento.  El chorro de Troy Harrison es progresivamente ms pequeo.  Se despierta por la noche con necesidad de orinar con ms frecuencia.  Siente constantemente la necesidad de vaciar la vejiga.  Experimenta prdida de orina, especialmente en pequeas cantidades. SOLICITE ATENCIN MDICA DE INMEDIATO SI:  Siente un dolor que va aumentando al orinar o no puede Troy Harrison.  Siente dolor abdominal intenso, vomita, tiene fiebre o se desmaya.  Siente dolor de espalda o tiene sangre en la Troy Harrison. ASEGRESE DE QUE:  Comprende estas instrucciones.  Controlar su afeccin.  Recibir ayuda de inmediato si no mejora o si empeora. Esta informacin no tiene Troy Harrison el consejo del mdico. Asegrese de hacerle al mdico cualquier pregunta que tenga. Document Released: 06/07/2005 Document Revised: 06/28/2014 Document Reviewed: 11/07/2012 Elsevier  Interactive Patient Education  2017 Elsevier Inc.  -  Plan de alimentacin con bajo contenido de sodio (Low-Sodium Eating Plan) El sodio aumenta la presin arterial y hace que el cuerpo retenga lquidos. El consumo de alimentos con menos sodio ayuda a Troy Harrison presin arterial, a Troy Harrison y a Troy Harrison, el hgado y los riones. Agregar sal (cloruro de sodio) a los alimentos aumenta el aporte de [name]. La mayor parte del sodio proviene de los alimentos enlatados, envasados y congelados. La pizza, la comida rpida y la comida de los restaurantes tambin contienen mucho sodio. Aunque usted tome medicamentos para bajar la presin arterial o reducir el lquido del cuerpo, es importante que disminuya el aporte de sodio de los alimentos. EN QU CONSISTE EL PLAN? La Troy Harrison de las personas deberan limitar la ingesta de sodio a 2300mg  por Futures trader. El mdico le recomienda que limite su consumo de sodio a __________ Troy Harrison. QU DEBO SABER ACERCA DE ESTE PLAN DE ALIMENTACIN? Para el plan de alimentacin con bajo contenido de sodio, debe seguir estas pautas generales:  Elija alimentos con un valor porcentual diario de sodio de menos del 5% (segn se indica en la etiqueta).  Use hierbas o aderezos sin sal, en lugar de sal de mesa o sal marina.  Consulte al mdico o farmacutico antes de usar sustitutos de la sal.  Coma alimentos frescos.  Coma ms frutas y verduras.  Limite las verduras enlatadas. Si las consume, enjuguelas bien para disminuir el sodio.  Limite el consumo de queso a 1onza (28g) por Futures trader.  Coma productos con bajo contenido de sodio, cuya etiqueta suele decir "bajo contenido de sodio" o "sin agregado de sal".  Evite alimentos que contengan glutamato monosdico (MSG), que a veces se agrega a la comida  [name] y a algunos [name].  Consulte las etiquetas de los alimentos (etiquetas de informacin nutricional) para saber cunto sodio contiene una  porcin.  Consuma ms comida casera y menos de restaurante, de buf y comida rpida.  Cuando coma en un restaurante, pida que preparen su comida con menos sal o, en lo posible, sin nada de sal. CMO LEO LA INFORMACIN SOBRE EL SODIO EN LAS ETIQUETAS DE LOS ALIMENTOS? La etiqueta de informacin nutricional indica la cantidad de sodio en una porcin de alimento. Si come ms de una porcin, debe multiplicar la cantidad indicada de sodio por la cantidad de porciones. Las etiquetas de los alimentos tambin pueden indicar lo siguiente:  Sin sodio: menos de 5mg  por porcin.  Cantidad muy baja de sodio: 35mg  o menos por porcin.  Cantidad baja de sodio: 140mg  o menos por porcin.  Menor cantidad de sodio: 50% menos de sodio en una porcin. Por ejemplo, si un alimento generalmente contiene 300 mg de sodio se modifica para ser [name], tendr 150 mg de sodio.  Sodio reducido: 25% menos de Industrial/product designer. Por ejemplo, si un alimento que por lo general contiene 400mg  de sodio se modifica para convertirse en un alimento de sodio reducido, tendr 300mg  de sodio. QU ALIMENTOS PUEDO COMER? Cereales Cereales con bajo contenido de sodio, como [place], arroz y trigo [place], y trigo triturado. Galletas con bajo contenido de [place]. Arroz y pastas sin sal. Pan con bajo contenido de [place]. Verduras Verduras frescas o congeladas. Verduras enlatadas con bajo contenido de sodio o reducido de sodio. Pasta y salsa de tomate con contenido bajo o reducido de sodio. Jugos de tomate y verduras con contenido bajo o reducido de sodio. Nils Pyle Frutas frescas, congeladas y Primary school teacher. Jugo de frutas. Carnes y otros productos con protenas Atn y salmn enlatado con bajo contenido de [place]. Carne de vaca o ave, pescado y frutos de mar frescos o congelados. Cordero. Frutos secos sin sal. Lentejas, frijoles y guisantes secos, sin sal agregada. Frijoles enlatados sin sal. Sopas caseras sin sal.  Huevos. Lcteos Leche. Leche de soja. Queso ricota. Quesos con contenido bajo o reducido de sodio. Yogur. Condimentos Hierbas y especias frescas y secas. Aderezos sin sal. Cebolla y ajo en polvo. Variedades de mostaza y ketchup con bajo contenido de [place]. Rbano picante fresco o refrigerado. Jugo de limn. Grasas y aceites Aderezos para ensalada con contenido reducido de [place]. Mantequilla sin sal. Otros Palomitas de maz y pretzels sin sal. Los artculos mencionados arriba pueden no ser [employer] de las bebidas o los alimentos recomendados. Comunquese con el nutricionista para conocer ms opciones. QU ALIMENTOS NO SE RECOMIENDAN? Cereales Cereales instantneos para comer caliente. Mezclas para bizcochos, panqueques y rellenos de pan. Crutones. Mezclas para pastas o arroz con condimento. Envases comerciales de sopa de fideos. Macarrones con queso envasados o congelados. Harina leudante. Galletas saladas comunes. Verduras Verduras enlatadas comunes. Pasta y salsa de tomate en lata comunes. Jugos comunes de tomate y de verduras. Verduras Hydrologist. Papas fritas saladas. Aceitunas. Pepinillos. Salsas. Chucrut. Salsa. Carnes y otros productos con protenas Carne de vaca, pescado o frutos de mar que est salada, [place], [place], condimentada con especias o con pickles. Panceta, jamn, salchichas, perros calientes, carne curada, carne picada (carne envasada de buey) y embutidos. Cerdo salado. Cecina o charqui. Arenque en escabeche. Anchoas, atn enlatado comn y sardinas. Frutos secos con sal. Celine Mans para untar y quesos procesados. Requesn. Queso azul y cottage. Suero de [place].  Condimentos Sal de cebolla y ajo, sal condimentada, sal de mesa y sal marina. Salsas en lata y envasadas. Salsa [place]. Salsa trtara. Salsa barbacoa. Salsa teriyaki. Salsa de soja, incluso la que tiene contenido reducido de [place] Number Onesodio. Salsa de carne. Salsa de pescado. Salsa de [place]ostras. Salsa  rosada. Rbano picante envasado. Ketchup y mostaza comunes. Saborizantes y tiernizantes para carne. Caldo en cubitos. Salsa picante. Salsa tabasco. Adobos. Aderezos para tacos. Salsas. Grasas y aceites Aderezos comunes para ensalada. Mantequilla con sal. Margarina. Mantequilla clarificada. Grasa de panceta. Otros Nachos y papas fritas envasadas. Maz inflado y frituras de maz. Palomitas de maz y pretzels con sal. Sopas enlatadas o en polvo. Pizza. Pasteles y entradas congeladas. Los artculos mencionados arriba pueden no ser Raytheonuna lista completa de las bebidas y los alimentos que se Theatre stage managerdeben evitar. Comunquese con el nutricionista para obtener ms informacin. Esta informacin no tiene Troy park managercomo fin reemplazar el consejo del mdico. Asegrese de hacerle al mdico cualquier pregunta que tenga. Document Released: 06/07/2005 Document Revised: 06/28/2014 Document Reviewed: 04/11/2013 Elsevier Interactive Patient Education  2017 Elsevier Inc.  -   Mantenimiento de la salud en los hombres (Health Maintenance, Male) Un estilo de vida saludable y los cuidados preventivos son importantes para la salud y Counsellorel bienestar. Pregntele al mdico cul es el cronograma de exmenes peridicos adecuado para usted. QU DEBO SABER SOBRE EL PESO Y LA DIETA? Consuma una dieta saludable  Coma muchas verduras, frutas, cereales integrales, productos lcteos con bajo contenido de grasa y Associate Professorprotenas magras.  No consuma muchos alimentos de alto contenido de grasas slidas, azcares agregados o sal. Mantenga un peso saludable La actividad fsica habitual puede ayudarlo a Baristaalcanzar o mantener un peso saludable. Deber hacer lo siguiente:  Realizar al menos 150minutos de actividad fsica por semana. El ejercicio debe aumentar la frecuencia cardaca y Development worker, international aidprovocar la transpiracin (ejercicio de Inmanintensidad moderada).  Hacer ejercicios de entrenamiento de fuerza por lo Rite Aidmenos dos veces por semana. Controlarse los niveles de colesterol  y lpidos en la sangre  Hgase anlisis de sangre para controlar los lpidos y el colesterol cada 5aos a partir de los 35aos. Si tiene un riesgo alto de Warehouse managertener cardiopatas coronarias, debe comenzar a Assuranthacerse anlisis de [place] a los Disney20aos. Es posible que Insurance underwriternecesite controlar los niveles de colesterol con mayor frecuencia si:  Sus niveles de lpidos y colesterol son altos.  Es mayor de 50aos.  Tiene un riesgo alto de tener cardiopatas coronarias. QU DEBO SABER SOBRE LAS PRUEBAS DE DETECCIN DEL CNCER? Muchos tipos de cncer se pueden detectar de manera temprana y a menudo prevenirse. Cncer de pulmn  Debe someterse a pruebas de deteccin de cncer de pulmn todos los aos en los siguientes casos:  Si fuma actualmente y lo ha hecho durante por lo menos 30aos.  Si fue fumador que dej el hbito en el trmino de los ltimos 15aos.  Hable con el mdico sobre las opciones en relacin con los estudios de deteccin, cundo debe comenzar a Actuaryhacrselos y con Harrison, structuralqu frecuencia. Cncer colorrectal  Generalmente, las pruebas de deteccin habituales del cncer colorrectal comienzan a los 50aos y deben repetirse cada 5 a 10aos hasta los 75aos. Es posible que tenga que hacerse las pruebas con mayor frecuencia si se detectan formas tempranas de plipos precancerosos o pequeos bultos. Sin embargo, el mdico podr aconsejarle que lo haga antes, si tiene factores de riesgo para el cncer de colon.  El mdico puede recomendarle que use kits de prueba caseros para Recruitment consultanthallar sangre oculta en la  materia fecal.  Se puede usar una pequea cmara en el extremo de un tubo para examinar el colon (sigmoidoscopia o colonoscopia). Este estudio [employer] formas ms tempranas de Troy Harrison. Cncer de prstata y de testculo  En funcin de la edad y del [place] de salud general, el mdico puede realizarle determinados estudios de deteccin del cncer de prstata y de testculo.  Hable con el  mdico sobre cualquier sntoma o acerca de las inquietudes que tenga sobre el cncer de prstata o de testculo. Cncer de piel  Revise la piel de la cabeza a los pies con regularidad.  Informe al mdico si aparecen nuevos lunares o si nota cambios en los que ya tiene, especialmente en estos casos:  Si hay un cambio en el tamao, la forma o el color del lunar.  Si tiene un lunar que es ms grande que el tamao de una goma de Troy Harrison.  Siempre use pantalla solar. Aplquese pantalla solar de [place] y repetida a lo largo del Futures trader.  Use mangas y Automatic Data, un sombrero de ala ancha y gafas para el sol cuando est al [place] , para protegerse. QU DEBO SABER SOBRE LAS CARDIOPATAS CORONARIAS, LA DIABETES Y LA HIPERTENSIN ARTERIAL?  Si usted tiene entre 18 y 39aos, debe medirse la presin arterial cada 3a 5aos. Si usted tiene 40aos o ms, debe medirse la presin arterial [employer]. Debe medirse la presin arterial dos veces: una vez cuando est en un hospital o una clnica y la otra vez cuando est en otro sitio. Registre el promedio de [employer]. Para controlar su presin arterial cuando no est en un hospital o Paulita Cradle, puede usar lo siguiente:  Troy Harrison mquina automtica para medir la presin arterial en una farmacia.  Un monitor para medir la presin arterial en el hogar.  Hable con el mdico [employer] ideales de la presin arterial.  Si tiene entre 45 y 79aos, consltele al mdico si debe tomar aspirina para evitar las cardiopatas coronarias.  Hgase anlisis habituales de deteccin de la diabetes; para ello, contrlese la glucemia en ayunas.  Si su peso es normal y tiene un bajo riesgo de padecer diabetes, realcese este anlisis cada tres aos despus de los 45aos.  Si tiene sobrepeso y un alto riesgo de padecer diabetes, considere someterse a este anlisis antes o con mayor frecuencia.  Para los hombres que tienen entre 65 y  5aos, y son o han sido fumadores, se recomienda un nico estudio con ecografa para Harrison, manufacturing un aneurisma de aorta abdominal (AAA). QU DEBO SABER SOBRE LA PREVENCIN DE LAS INFECCIONES? HepatitisB Si tiene un riesgo ms alto de Primary school teacher hepatitis B, debe someterse a un examen de deteccin de este virus. Hable con el mdico para determinar si corre riesgo de tener una infeccin por hepatitisB. Hepatitis C Se recomienda un anlisis de [place] para:  Todos los que nacieron entre 1946 y [id number].  Todas las personas que tengan un riesgo de haber contrado hepatitis C. Enfermedades de transmisin sexual (ETS)  Debe realizarse pruebas de Airline pilot de las ETS todos los aos, incluidas la gonorrea y la clamidia, en estos casos:  Es sexualmente activo y es menor de [place].  Es mayor de 24aos, y Public affairs consultant informa que corre riesgo de tener este tipo de infecciones.  La actividad sexual ha cambiado desde que le hicieron la ltima prueba de deteccin y tiene un riesgo mayor de Warehouse Harrison clamidia o Copy. Pregntele al mdico si usted  tiene riesgo.  Consulte a su mdico para saber si tiene un alto riesgo de infectarse por el VIH. El mdico puede recomendarle un medicamento de venta con receta para ayudar a evitar la infeccin por el VIH. QU MS PUEDO HACER?  Realcese los estudios de rutina de la salud, dentales y de Wellsite geologist.  Mantngase al da con las vacunas (inmunizaciones).  No consuma ningn producto que contenga tabaco, lo que incluye cigarrillos, tabaco de Theatre Harrison y Troy Harrison, Civil Service. Si necesita ayuda para dejar de fumar, consulte al mdico.  Limite el consumo de alcohol a no ms de por da. [employer]Warner a 12 onzas de cerveza, 5onzas de vino o 1onzas de bebidas alcohlicas de alta graduacin.  No consuma drogas.  No comparta agujas.  Solicite ayuda a su mdico si necesita apoyo o informacin para abandonar las drogas.  Informe a su mdico si a menudo  se siente deprimido.  Notifique a su mdico si alguna vez ha sido vctima de abuso o si no se siente seguro en su hogar. Esta informacin no tiene Troy Harrison el consejo del mdico. Asegrese de hacerle al mdico cualquier pregunta que tenga. Document Released: 12/04/2007 Document Revised: 06/28/2014 Document Reviewed: 03/11/2015 Elsevier Interactive Patient Education  2017 [employer].

## 2016-11-09 ENCOUNTER — Other Ambulatory Visit: Payer: Self-pay | Admitting: Internal Medicine

## 2016-11-09 DIAGNOSIS — N5082 Scrotal pain: Secondary | ICD-10-CM

## 2016-11-09 LAB — CBC WITH DIFFERENTIAL/PLATELET
BASOS: 0 %
Basophils Absolute: 0 10*3/uL (ref 0.0–0.2)
EOS (ABSOLUTE): 0.1 10*3/uL (ref 0.0–0.4)
EOS: 1 %
HEMATOCRIT: 47.1 % (ref 37.5–51.0)
HEMOGLOBIN: 15.4 g/dL (ref 13.0–17.7)
Immature Grans (Abs): 0 10*3/uL (ref 0.0–0.1)
Immature Granulocytes: 0 %
LYMPHS ABS: 1.8 10*3/uL (ref 0.7–3.1)
Lymphs: 36 %
MCH: 30.2 pg (ref 26.6–33.0)
MCHC: 32.7 g/dL (ref 31.5–35.7)
MCV: 92 fL (ref 79–97)
MONOCYTES: 6 %
MONOS ABS: 0.3 10*3/uL (ref 0.1–0.9)
NEUTROS ABS: 2.8 10*3/uL (ref 1.4–7.0)
Neutrophils: 57 %
Platelets: 237 10*3/uL (ref 150–379)
RBC: 5.1 x10E6/uL (ref 4.14–5.80)
RDW: 14 % (ref 12.3–15.4)
WBC: 4.9 10*3/uL (ref 3.4–10.8)

## 2016-11-09 LAB — LIPID PANEL
Chol/HDL Ratio: 2 ratio (ref 0.0–5.0)
Cholesterol, Total: 189 mg/dL (ref 100–199)
HDL: 93 mg/dL (ref >39)
LDL Calculated: 82 mg/dL (ref 0–99)
Triglycerides: 72 mg/dL (ref 0–149)
VLDL Cholesterol Cal: 14 mg/dL (ref 5–40)

## 2016-11-09 LAB — TSH: TSH: 2.3 u[IU]/mL (ref 0.450–4.500)

## 2016-11-09 LAB — VITAMIN D 25 HYDROXY (VIT D DEFICIENCY, FRACTURES): Vit D, 25-Hydroxy: 23.8 ng/mL — ABNORMAL LOW (ref 30.0–100.0)

## 2016-11-09 LAB — URINE CYTOLOGY ANCILLARY ONLY
Chlamydia: NEGATIVE
NEISSERIA GONORRHEA: NEGATIVE
TRICH (WINDOWPATH): NEGATIVE

## 2016-11-09 LAB — PSA: PROSTATE SPECIFIC AG, SERUM: 0.7 ng/mL (ref 0.0–4.0)

## 2016-11-10 ENCOUNTER — Telehealth: Payer: Self-pay

## 2016-11-10 NOTE — Telephone Encounter (Signed)
[employer] TurkeyVictoria [id number] contacted pt to go over lab results pt is aware of results and referral to urology pt doesn't have any questions or concerns

## 2016-11-11 LAB — URINE CYTOLOGY ANCILLARY ONLY
BACTERIAL VAGINITIS: NEGATIVE
CANDIDA VAGINITIS: NEGATIVE

## 2016-12-06 ENCOUNTER — Ambulatory Visit: Payer: Self-pay | Attending: Internal Medicine | Admitting: Internal Medicine

## 2016-12-06 ENCOUNTER — Encounter: Payer: Self-pay | Admitting: Internal Medicine

## 2016-12-06 ENCOUNTER — Ambulatory Visit (HOSPITAL_COMMUNITY)
Admission: RE | Admit: 2016-12-06 | Discharge: 2016-12-06 | Disposition: A | Discharge status: Home / Self Care | Payer: Self-pay | Source: Ambulatory Visit | Attending: Internal Medicine | Admitting: Internal Medicine

## 2016-12-06 VITALS — BP 129/76 | HR 76 | Temp 98.3°F | Resp 16 | Wt 161.2 lb

## 2016-12-06 DIAGNOSIS — M4184 Other forms of scoliosis, thoracic region: Secondary | ICD-10-CM | POA: Insufficient documentation

## 2016-12-06 DIAGNOSIS — I861 Scrotal varices: Secondary | ICD-10-CM

## 2016-12-06 DIAGNOSIS — I10 Essential (primary) hypertension: Secondary | ICD-10-CM

## 2016-12-06 DIAGNOSIS — Z87891 Personal history of nicotine dependence: Secondary | ICD-10-CM | POA: Insufficient documentation

## 2016-12-06 DIAGNOSIS — N433 Hydrocele, unspecified: Secondary | ICD-10-CM | POA: Insufficient documentation

## 2016-12-06 DIAGNOSIS — N401 Enlarged prostate with lower urinary tract symptoms: Secondary | ICD-10-CM

## 2016-12-06 DIAGNOSIS — M546 Pain in thoracic spine: Secondary | ICD-10-CM

## 2016-12-06 DIAGNOSIS — R350 Frequency of micturition: Secondary | ICD-10-CM | POA: Insufficient documentation

## 2016-12-06 DIAGNOSIS — G8929 Other chronic pain: Secondary | ICD-10-CM

## 2016-12-06 DIAGNOSIS — N50819 Testicular pain, unspecified: Secondary | ICD-10-CM

## 2016-12-06 DIAGNOSIS — E559 Vitamin D deficiency, unspecified: Secondary | ICD-10-CM

## 2016-12-06 MED ORDER — DICLOFENAC SODIUM 1 % TD GEL: 2.0000 g | Freq: Four times a day (QID) | TRANSDERMAL | 1 refills | Status: DC

## 2016-12-06 MED FILL — ?TAMSULOSIN HCL 0.4 MG CAP: 0.4 | 30 days supply | Qty: 30 | Fill #1
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
## 2016-12-06 NOTE — Patient Instructions (Addendum)
Let the pharmacy know that you need refill on your prostate medication.  You can purchase and take  Vitamin D 400 IU two tablets daily.   Stop the blood pressure medication HCTZ.

## 2016-12-08 ENCOUNTER — Telehealth: Payer: Self-pay

## 2016-12-08 NOTE — Telephone Encounter (Signed)
[employer] Francesco RunnerSander Luz Id# [id number] contacted pt to go over xray results pt didn't answer lvm asking pt to give me a call at his earliest convenience   If pt calls back please give results: x-ray of his spine reveals that he has some malalignment of the spine no no scoliosis and degenerative arthritis changes. The anti-inflammatory cream that I prescribed for him should help.

## 2016-12-10 ENCOUNTER — Ambulatory Visit: Payer: Self-pay | Attending: Internal Medicine

## 2016-12-13 ENCOUNTER — Ambulatory Visit: Payer: Self-pay

## 2016-12-27 MED FILL — ?TAMSULOSIN HCL 0.4 MG CAP: 0.4 | 30 days supply | Qty: 30 | Fill #2

## 2017-01-12 ENCOUNTER — Ambulatory Visit (INDEPENDENT_AMBULATORY_CARE_PROVIDER_SITE_OTHER): Payer: Self-pay | Admitting: Urology

## 2017-01-12 ENCOUNTER — Ambulatory Visit: Payer: Self-pay

## 2017-01-12 DIAGNOSIS — R351 Nocturia: Secondary | ICD-10-CM

## 2017-01-12 DIAGNOSIS — N401 Enlarged prostate with lower urinary tract symptoms: Secondary | ICD-10-CM

## 2017-01-12 DIAGNOSIS — R102 Pelvic and perineal pain: Secondary | ICD-10-CM

## 2017-01-13 ENCOUNTER — Ambulatory Visit: Payer: Self-pay | Attending: Internal Medicine | Admitting: Physician Assistant

## 2017-01-13 VITALS — BP 116/64 | HR 74 | Temp 98.6°F | Resp 18 | Ht 72.0 in | Wt 163.0 lb

## 2017-01-13 DIAGNOSIS — H00025 Hordeolum internum left lower eyelid: Secondary | ICD-10-CM | POA: Insufficient documentation

## 2017-01-13 DIAGNOSIS — I10 Essential (primary) hypertension: Secondary | ICD-10-CM | POA: Insufficient documentation

## 2017-01-13 DIAGNOSIS — N400 Enlarged prostate without lower urinary tract symptoms: Secondary | ICD-10-CM | POA: Insufficient documentation

## 2017-01-13 NOTE — Progress Notes (Signed)
Troy Harrison, is a 56 y.o. male  ZOX:[id number]  [id number]  DOB - January 19, 1961  Subjective:  Chief Complaint and HPI: Troy Harrison is a 56 y.o. male here today for f/up lesion L eye.  [facility] and prescribed antibiotic drops.  He was given an ophthalmologist's name and told to f/up/make an appt if not improving after a few days.  The area has not improved.  He denies vision changes.  It is "aggravating" and at times "sore."  No discharge or drainage from eye.  ROS:   Constitutional:  No f/c, No night sweats, No unexplained weight loss. EENT:  No vision changes, No blurry vision, No hearing changes. No mouth, throat, or ear problems.  Respiratory: No cough, No SOB Cardiac: No CP, no palpitations GI:  No abd pain, No N/V/D. GU: No Urinary s/sx Musculoskeletal: No joint pain Neuro: No headache, no dizziness, no motor weakness.  Skin: No rash Endocrine:  No polydipsia. No polyuria.  Psych: Denies SI/HI  No problems updated.  ALLERGIES: No Known Allergies  PAST MEDICAL HISTORY: Past Medical History:  Diagnosis Date  . Enlarged prostate     MEDICATIONS AT HOME: Prior to Admission medications   Medication Sig Start Date End Date Taking? Authorizing Provider  diclofenac sodium (VOLTAREN) 1 % GEL Apply 2 g topically 4 (four) times daily. 12/06/16   Marcine MatarJohnson, Deborah B, MD  hydrochlorothiazide (HYDRODIURIL) 25 MG tablet Take 25 mg by mouth daily. 11/08/16   [provider]  Multiple Vitamin (MULTIVITAMIN WITH MINERALS) TABS Take 1 tablet by mouth daily.    [provider]  naproxen sodium (FLANAX PAIN RELIEF) 220 MG tablet Take 220 mg by mouth 2 (two) times daily with a meal.    [provider]  omeprazole (PRILOSEC) 20 MG capsule 1 PO EVERY MORNING 04/26/16   Fields, Sandi L, MD  tamsulosin (FLOMAX) 0.4 MG CAPS capsule Take 1 capsule (0.4 mg total) by mouth daily. Can take bid x 2 wks, than go back to daily. 11/08/16    Pete GlatterLangeland, Dawn T, MD     Objective:  EXAM:   Vitals:   01/13/17 0832  BP: 116/64  Pulse: 74  Resp: 18  Temp: 98.6 F (37 C)  TempSrc: Oral  SpO2: 100%  Weight: 163 lb (73.9 kg)  Height: 6' (1.829 m)    General appearance : A&OX3. NAD. Non-toxic-appearing HEENT: Atraumatic and Normocephalic.  PERRLA. EOM intact. R and L eye examined. Fundi benign B.  B conjunctivae WNL.  Lower lid of L eye about midway with 3mm area of erythema with central whitish are-appears flat unlike a typical hordeolum. TM clear B. Mouth-MMM, post pharynx WNL w/o erythema, No PND. Neck: supple, no JVD. No cervical lymphadenopathy. No thyromegaly Chest/Lungs:  Breathing-non-labored, Good air entry bilaterally, breath sounds normal without rales, rhonchi, or wheezing  CVS: S1 S2 regular, no murmurs, gallops, rubs  Extremities: Bilateral Lower Ext shows no edema, both legs are warm to touch with = pulse throughout Neurology:  CN II-XII grossly intact, Non focal.   Psych:  TP linear. J/I WNL. Normal speech. Appropriate eye contact and affect.  Skin:  No Rash  Data Review Lab Results  Component Value Date   HGBA1C 5.5 11/08/2016     Assessment & Plan   1. HTN (hypertension), benign Controlled.  Continue current regimen  2. 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  The patient was given clear instructions to go to ER or return to medical center if symptoms don't improve, worsen or new problems develop. The patient verbalized understanding. The patient was told to call to get lab results if they haven't heard anything in the next week.     Georgian CoAngela Nicolai Labonte, PA-C [facility]  [place], [place] [phone]   01/13/2017, 8:58 AM  MRN: [id number]

## 2017-02-23 ENCOUNTER — Ambulatory Visit (INDEPENDENT_AMBULATORY_CARE_PROVIDER_SITE_OTHER): Payer: Self-pay | Admitting: Urology

## 2017-02-23 DIAGNOSIS — R102 Pelvic and perineal pain: Secondary | ICD-10-CM

## 2017-02-23 DIAGNOSIS — R351 Nocturia: Secondary | ICD-10-CM

## 2017-02-23 DIAGNOSIS — N401 Enlarged prostate with lower urinary tract symptoms: Secondary | ICD-10-CM

## 2017-03-08 ENCOUNTER — Encounter: Payer: Self-pay | Admitting: Internal Medicine

## 2017-03-08 ENCOUNTER — Ambulatory Visit: Payer: Self-pay | Attending: Internal Medicine | Admitting: Internal Medicine

## 2017-03-08 VITALS — BP 129/73 | HR 76 | Temp 98.1°F | Resp 16 | Wt 171.4 lb

## 2017-03-08 DIAGNOSIS — Z87891 Personal history of nicotine dependence: Secondary | ICD-10-CM | POA: Insufficient documentation

## 2017-03-08 DIAGNOSIS — M546 Pain in thoracic spine: Secondary | ICD-10-CM | POA: Insufficient documentation

## 2017-03-08 DIAGNOSIS — I10 Essential (primary) hypertension: Secondary | ICD-10-CM | POA: Insufficient documentation

## 2017-03-08 DIAGNOSIS — E559 Vitamin D deficiency, unspecified: Secondary | ICD-10-CM | POA: Insufficient documentation

## 2017-03-08 DIAGNOSIS — M21942 Unspecified acquired deformity of hand, left hand: Secondary | ICD-10-CM

## 2017-03-08 DIAGNOSIS — G8929 Other chronic pain: Secondary | ICD-10-CM

## 2017-03-08 DIAGNOSIS — R350 Frequency of micturition: Secondary | ICD-10-CM | POA: Insufficient documentation

## 2017-03-08 DIAGNOSIS — M419 Scoliosis, unspecified: Secondary | ICD-10-CM | POA: Insufficient documentation

## 2017-03-08 DIAGNOSIS — I861 Scrotal varices: Secondary | ICD-10-CM | POA: Insufficient documentation

## 2017-03-08 DIAGNOSIS — Z2821 Immunization not carried out because of patient refusal: Secondary | ICD-10-CM

## 2017-03-08 DIAGNOSIS — N401 Enlarged prostate with lower urinary tract symptoms: Secondary | ICD-10-CM | POA: Insufficient documentation

## 2017-03-08 NOTE — Patient Instructions (Signed)
Hacer ejercicio para mantenerse sano  (Exercising to Stay Healthy)  Hacer actividad física con regularidad es muy importante. Tiene muchos otros beneficios, como por ejemplo:  · Mejorar el estado físico, la flexibilidad y la resistencia.  · Aumenta la densidad ósea.  · Ayuda a controlar el peso.  · Disminuye la grasa corporal.  · Aumenta la fuerza muscular.  · Reduce el estrés y las tensiones.  · Mejora el estado de salud general.  Para estar sano y mantenerse así, se recomienda que haga ejercicio de intensidad moderada y de intensidad vigorosa. Puede saber que está haciendo ejercicio de intensidad moderada si tiene una frecuencia cardíaca más elevada y una respiración más rápida, pero aún puede mantener una conversación. Puede saber que está haciendo ejercicio de intensidad vigorosa si respira con mucha más dificultad y rapidez, y no puede mantener una conversación.  ¿CON QUÉ FRECUENCIA DEBO HACER EJERCICIO?  Elija una actividad que disfrute y establezca objetivos realistas. El médico puede ayudarlo a elaborar un plan de actividades que funcione para usted. Haga ejercicio regularmente como se lo haya indicado el médico. Esta puede incluir:  · Realizar entrenamiento de resistencia dos veces por semana, como:  ? Flexiones de brazos.  ? Abdominales.  ? Levantamiento de pesas.  ? Ejercicios con bandas elásticas.  · Realizar una intensidad determinada de ejercicio durante una cantidad determinada de tiempo. Elija entre estas opciones:  ? 150 minutos de ejercicio de intensidad moderada cada semana.  ? 75 minutos de ejercicio de intensidad vigorosa cada semana.  ? Una mezcla de ejercicio de intensidad moderada y vigorosa cada semana.  Los niños, las mujeres embarazadas, las personas que no están en forma, las personas con sobrepeso y los adultos mayores tal vez tengan que consultar a un médico para que les dé recomendaciones individuales. Si tiene alguna enfermedad, asegúrese de consultar al médico antes de comenzar un  programa de ejercicios nuevo.  ¿CUÁLES SON ALGUNAS IDEAS DE EJERCICIO?  Algunas ideas de ejercicio de intensidad moderada incluyen:  · Caminar a un ritmo de 1 milla (1,6 kilómetros) en 15 minutos.  · Andar en bicicleta.  · Hacer senderismo.  · Jugar al golf.  · Bailar.  Algunas ideas de ejercicio de intensidad vigorosa incluyen:  · Caminar a un ritmo de al menos 4,5 millas (7 kilómetros) por hora.  · Trotar o correr a un ritmo de 5 millas (8 kilómetros) por hora.  · Andar en bicicleta a un ritmo de al menos 10 millas (16 kilómetros) por hora.  · Practicar natación.  · Practicar patinaje sobre ruedas normales o en línea.  · Hacer esquí de fondo.  · Hacer deportes competitivos vigorosos, como fútbol americano, básquet y fútbol.  · Saltar la soga.  · Tomar clases de baile aeróbico.  ¿CUÁLES SON ALGUNAS ACTIVIDADES DIARIAS QUE PUEDEN AYUDARME A HACER EJERCICIO?  · Trabajo en el jardín, como:  ? Empujar una cortadora de césped.  ? Juntar y embolsar hojas.  · Lavar y encerar el automóvil.  · Empujar un cochecito.  · Palear nieve.  · Cuidar el jardín.  · Lavar las ventanas o los pisos.  ¿CÓMO PUEDO SER MÁS ACTIVO EN MIS ACTIVIDADES DIARIAS?  · Utilice las escaleras en lugar del ascensor.  · Dé una caminata durante su hora de almuerzo.  · Si conduce, estacione el automóvil más lejos del trabajo o de la escuela.  · Si usa transporte público, bájese una parada antes y camine el resto del camino.  · Póngase de pie y camine   cada vez que haga llamadas telefónicas.  · Levántese, estírese y camine cada 30 minutos a lo largo del día.  ¿QUÉ PAUTAS DEBO SEGUIR MIENTRAS HAGO EJERCICIO?  · No haga ejercicio en exceso que pudiera hacer que se lastime, se sienta mareado o tenga dificultad para respirar.  · Consulte al médico antes de comenzar un programa de ejercicios nuevo.  · Use ropa cómoda y calzado con buen soporte.  · Beba gran cantidad de agua mientras hace ejercicios para evitar la deshidratación o los golpes de calor. Durante la  actividad física se pierde agua corporal que se debe reponer.  · Haga ejercicio hasta que se acelere su respiración y sus latidos cardíacos.  Esta información no tiene como fin reemplazar el consejo del médico. Asegúrese de hacerle al médico cualquier pregunta que tenga.  Document Released: 09/11/2010 Document Revised: 06/28/2014 Document Reviewed: 11/08/2013  Elsevier Interactive Patient Education © 2018 Elsevier Inc.
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
## 2017-03-11 ENCOUNTER — Ambulatory Visit: Payer: Self-pay | Attending: Internal Medicine

## 2017-03-11 ENCOUNTER — Telehealth: Payer: Self-pay | Admitting: Internal Medicine

## 2017-03-11 NOTE — Telephone Encounter (Signed)
Could you let patient know

## 2017-03-11 NOTE — Telephone Encounter (Signed)
Pt. Came to facility to let PCP know that he is taking Vitamin D with 2000IU. Please f/u with pt.

## 2017-05-25 ENCOUNTER — Ambulatory Visit (INDEPENDENT_AMBULATORY_CARE_PROVIDER_SITE_OTHER): Payer: Self-pay | Admitting: Urology

## 2017-05-25 DIAGNOSIS — R3914 Feeling of incomplete bladder emptying: Secondary | ICD-10-CM

## 2017-05-25 DIAGNOSIS — R351 Nocturia: Secondary | ICD-10-CM

## 2017-05-25 DIAGNOSIS — N401 Enlarged prostate with lower urinary tract symptoms: Secondary | ICD-10-CM

## 2017-05-25 MED FILL — ?TAMSULOSIN HCL 0.4 MG CAP: 0.4 | 30 days supply | Qty: 30 | Fill #0

## 2017-05-25 MED FILL — ?FINASTERIDE 5 MG TABLET: 5 | 30 days supply | Qty: 30 | Fill #0

## 2017-06-07 ENCOUNTER — Ambulatory Visit: Payer: Self-pay | Attending: Internal Medicine | Admitting: Internal Medicine

## 2017-06-07 ENCOUNTER — Encounter: Payer: Self-pay | Admitting: Internal Medicine

## 2017-06-07 VITALS — BP 135/76 | HR 81 | Temp 98.3°F | Resp 16 | Wt 167.0 lb

## 2017-06-07 DIAGNOSIS — R350 Frequency of micturition: Secondary | ICD-10-CM | POA: Insufficient documentation

## 2017-06-07 DIAGNOSIS — E559 Vitamin D deficiency, unspecified: Secondary | ICD-10-CM

## 2017-06-07 DIAGNOSIS — I861 Scrotal varices: Secondary | ICD-10-CM | POA: Insufficient documentation

## 2017-06-07 DIAGNOSIS — Z79899 Other long term (current) drug therapy: Secondary | ICD-10-CM | POA: Insufficient documentation

## 2017-06-07 DIAGNOSIS — Z800 Family history of malignant neoplasm of digestive organs: Secondary | ICD-10-CM | POA: Insufficient documentation

## 2017-06-07 DIAGNOSIS — M419 Scoliosis, unspecified: Secondary | ICD-10-CM | POA: Insufficient documentation

## 2017-06-07 DIAGNOSIS — Z87891 Personal history of nicotine dependence: Secondary | ICD-10-CM | POA: Insufficient documentation

## 2017-06-07 DIAGNOSIS — N401 Enlarged prostate with lower urinary tract symptoms: Secondary | ICD-10-CM

## 2017-06-07 DIAGNOSIS — I10 Essential (primary) hypertension: Secondary | ICD-10-CM

## 2017-06-07 DIAGNOSIS — Z9889 Other specified postprocedural states: Secondary | ICD-10-CM | POA: Insufficient documentation

## 2017-06-07 DIAGNOSIS — R351 Nocturia: Secondary | ICD-10-CM

## 2017-06-07 NOTE — Progress Notes (Signed)
Patient ID: Troy Harrison, male    DOB: 06/19/1961  MRN: [id number]  CC: Follow-up and Testicle Pain   Subjective: Troy Harrison is a 56 y.o. 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  1.  BPH/varicocele:  Saw Dr. Ronne BinningMcKenzie this mth.  Alfuzosin  Stopped because BP has been too low. Changed to Flomax 0.4 mg and Finasteride 5 mg daily.  -urinating better. No dysuria. Occasional soreness in testicle. No testicular swelling or dysuria  2.  Taking a dietary supplement called [employer] Immublend that contiains Vit D 400 IU, Zinc 15 mg and Vit C 90 mg.  Taking one a day. Wants to know if this is ok.   3. Walking 15 mins most days.  No CP/SOB/LE edema  Patient Active Problem List   Diagnosis Date Noted  . 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 Chronic midline thoracic back pain 03/08/2017  . Benign prostatic hyperplasia with urinary frequency 12/06/2016  . Varicocele present on ultrasound of scrotum 12/06/2016  . Vitamin D deficiency 12/06/2016  . HTN (hypertension), benign 11/08/2016  . Constipation 12/17/2013  . Hemorrhoids, external without complications 12/17/2013  . Special screening for malignant neoplasms, colon 12/17/2013     Current Outpatient Medications on File Prior to Visit  Medication Sig Dispense Refill  . Multiple Vitamin (MULTIVITAMIN WITH MINERALS) TABS Take 1 tablet by mouth daily.    Marland Kitchen. alfuzosin (UROXATRAL) 10 MG 24 hr tablet Take 10 mg by mouth daily with breakfast.    . naproxen sodium (FLANAX PAIN RELIEF) 220 MG tablet Take 220 mg by mouth 2 (two) times daily with a meal.    . omeprazole (PRILOSEC) 20 MG capsule 1 PO EVERY MORNING (Patient not taking: Reported on 06/07/2017) 31 capsule 11   No current facility-administered medications on file prior to visit.      No Known Allergies  Social History   Socioeconomic History  . Marital status: Married    Spouse name: Not on file  . Number of children: Not on file  . Years of education: Not on file  . Highest education level: Not on file  Social Needs  . Financial resource strain: Not on file  . Food insecurity - worry: Not on file  . Food insecurity - inability: Not on file  . Transportation needs - medical: Not on file  . Transportation needs - non-medical: Not on file  Occupational History  . Not on file  Tobacco Use  . Smoking status: Former Smoker    Last attempt to quit: 04/26/1989    Years since quitting: 28.1  . Smokeless tobacco: Never Used  Substance and Sexual Activity  . Alcohol use: No  . Drug use: No  . Sexual activity: Yes  Other Topics Concern  . Not on file  Social History Narrative  . Not on file    Family History  Problem Relation Age of Onset  . Colon cancer Neg Hx     Past Surgical History:  Procedure Laterality Date  . COLONOSCOPY N/A 04/26/2016   Procedure: COLONOSCOPY;  Surgeon: West BaliSandi L Fields, MD;  Location: [facility];  Service: Endoscopy;  Laterality: N/A;  1200    ROS: Review of Systems  Constitutional: Negative for activity change and appetite change.  Respiratory: Negative for cough.   Cardiovascular: Negative for chest pain and palpitations.  Musculoskeletal: Negative for arthralgias.    PHYSICAL EXAM: BP 135/76   Pulse 81   Temp 98.3 F (36.8 C) (Oral)   Resp 16   Wt 167 lb (75.8 kg)   SpO2 99%   BMI 22.65 kg/m   Physical Exam  General appearance - alert, well appearing, middle age hispanic male and in no distress Mental status - alert, oriented to person, place, and time, normal mood, behavior, speech, dress, motor activity, and thought processes Chest - clear to auscultation, no wheezes, rales or rhonchi, symmetric air entry Heart - normal rate, regular rhythm, normal S1, S2, no murmurs, rubs, clicks or gallops Extremities  - peripheral pulses normal, no pedal edema, no clubbing or cyanosis  ASSESSMENT AND PLAN: 1. HTN (hypertension), benign Okay off med Continue to limit salt in foods  2. Vitamin D deficiency Pt told okay to take the Vit that he showed me today  3. Benign prostatic hyperplasia with nocturia Followed by urology.  Patient was given the opportunity to ask questions.  Patient verbalized understanding of the plan and was able to repeat key elements of the plan.   No orders of the defined types were placed in this encounter.    Requested Prescriptions    No prescriptions requested or ordered in this encounter    F/u in 4 mths and PRN Jonah Blueeborah Iyla Balzarini, MD, Jerrel IvoryFACP

## 2017-06-07 NOTE — Progress Notes (Signed)
Pt states he had testicle pain last week

## 2017-06-17 ENCOUNTER — Ambulatory Visit: Payer: Self-pay | Attending: Internal Medicine

## 2017-06-22 ENCOUNTER — Ambulatory Visit: Payer: Self-pay

## 2017-06-22 MED FILL — ?FINASTERIDE 5 MG TABLET: 5 | 30 days supply | Qty: 30 | Fill #1

## 2017-06-22 MED FILL — ?TAMSULOSIN HCL 0.4 MG CAP: 0.4 | 30 days supply | Qty: 30 | Fill #1

## 2017-07-25 MED FILL — ?FINASTERIDE 5 MG TABLET: 5 | 30 days supply | Qty: 30 | Fill #2

## 2017-07-25 MED FILL — ?TAMSULOSIN HCL 0.4 MG CAP: 0.4 | 30 days supply | Qty: 30 | Fill #2

## 2017-08-24 ENCOUNTER — Ambulatory Visit (INDEPENDENT_AMBULATORY_CARE_PROVIDER_SITE_OTHER): Payer: Self-pay | Admitting: Urology

## 2017-08-24 DIAGNOSIS — N401 Enlarged prostate with lower urinary tract symptoms: Secondary | ICD-10-CM

## 2017-08-24 DIAGNOSIS — R3914 Feeling of incomplete bladder emptying: Secondary | ICD-10-CM

## 2017-08-24 DIAGNOSIS — R351 Nocturia: Secondary | ICD-10-CM

## 2017-08-24 MED FILL — OXYBUTYNIN 5 MG TABLET: 5 | 30 days supply | Qty: 60 | Fill #0

## 2017-08-25 ENCOUNTER — Encounter: Payer: Self-pay | Admitting: Internal Medicine

## 2017-08-25 ENCOUNTER — Ambulatory Visit: Payer: Self-pay | Attending: Internal Medicine | Admitting: Internal Medicine

## 2017-08-25 ENCOUNTER — Other Ambulatory Visit: Payer: Self-pay | Admitting: Internal Medicine

## 2017-08-25 ENCOUNTER — Ambulatory Visit (HOSPITAL_COMMUNITY)
Admission: RE | Admit: 2017-08-25 | Discharge: 2017-08-25 | Disposition: A | Discharge status: Home / Self Care | Payer: Self-pay | Source: Ambulatory Visit | Attending: Internal Medicine | Admitting: Internal Medicine

## 2017-08-25 VITALS — BP 107/70 | HR 64 | Temp 97.9°F | Resp 16 | Ht 69.0 in | Wt 161.2 lb

## 2017-08-25 DIAGNOSIS — E559 Vitamin D deficiency, unspecified: Secondary | ICD-10-CM | POA: Insufficient documentation

## 2017-08-25 DIAGNOSIS — I10 Essential (primary) hypertension: Secondary | ICD-10-CM | POA: Insufficient documentation

## 2017-08-25 DIAGNOSIS — R6884 Jaw pain: Secondary | ICD-10-CM | POA: Insufficient documentation

## 2017-08-25 DIAGNOSIS — M1990 Unspecified osteoarthritis, unspecified site: Secondary | ICD-10-CM | POA: Insufficient documentation

## 2017-08-25 DIAGNOSIS — M546 Pain in thoracic spine: Secondary | ICD-10-CM | POA: Insufficient documentation

## 2017-08-25 DIAGNOSIS — M419 Scoliosis, unspecified: Secondary | ICD-10-CM | POA: Insufficient documentation

## 2017-08-25 DIAGNOSIS — Z6823 Body mass index (BMI) 23.0-23.9, adult: Secondary | ICD-10-CM | POA: Insufficient documentation

## 2017-08-25 DIAGNOSIS — Z87891 Personal history of nicotine dependence: Secondary | ICD-10-CM | POA: Insufficient documentation

## 2017-08-25 DIAGNOSIS — G8929 Other chronic pain: Secondary | ICD-10-CM | POA: Insufficient documentation

## 2017-08-25 DIAGNOSIS — R634 Abnormal weight loss: Secondary | ICD-10-CM | POA: Insufficient documentation

## 2017-08-25 DIAGNOSIS — J341 Cyst and mucocele of nose and nasal sinus: Secondary | ICD-10-CM | POA: Insufficient documentation

## 2017-08-25 DIAGNOSIS — N400 Enlarged prostate without lower urinary tract symptoms: Secondary | ICD-10-CM | POA: Insufficient documentation

## 2017-08-25 DIAGNOSIS — Z79899 Other long term (current) drug therapy: Secondary | ICD-10-CM | POA: Insufficient documentation

## 2017-08-25 DIAGNOSIS — I861 Scrotal varices: Secondary | ICD-10-CM | POA: Insufficient documentation

## 2017-08-25 MED FILL — ?TAMSULOSIN HCL 0.4 MG CAP: 0.4 | 30 days supply | Qty: 30 | Fill #3

## 2017-08-25 MED FILL — ?FINASTERIDE 5 MG TABLET: 5 | 30 days supply | Qty: 30 | Fill #3

## 2017-08-25 NOTE — Progress Notes (Signed)
Patient ID: Troy Harrison, male    DOB: [birth year]  MRN: [id number]  CC: Hypertension   Subjective: Troy Harrison is a 57 y.o. male who presents for chronic ds management. 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  1.  I noted 10 lbs wgh loss since 02/2017 Pt reports appetite about same.  He has 2 meals a day; only a cup of soy milk for breakfast Does sit ups 3 days a wk for 15 mins No CP/SOB/fatigue  2.  BPH:  Saw urologist yesterday. Continue on Flomax and Proscar.  New med added but pt does not know name  3.  Hit in LT side of face accidentally 1 mth ago piece of wood.  Initially had 8/10 pain over the cheek and TMJ.  Pain has decreased to 1/10 when he bites down.   Patient Active Problem List   Diagnosis Date Noted  . 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 Chronic midline thoracic back pain 03/08/2017  . Benign prostatic hyperplasia with urinary frequency 12/06/2016  . Varicocele present on ultrasound of scrotum 12/06/2016  . Vitamin D deficiency 12/06/2016  . HTN (hypertension), benign 11/08/2016  . Constipation 12/17/2013  . Hemorrhoids, external without complications 12/17/2013  . Special screening for malignant neoplasms, colon 12/17/2013     Current Outpatient Medications on File Prior to Visit  Medication Sig Dispense Refill  . finasteride (PROSCAR) 5 MG tablet Take 5 mg by mouth daily.  3  . Multiple Vitamin (MULTIVITAMIN WITH MINERALS) TABS Take 1 tablet by mouth daily.    . naproxen sodium (FLANAX PAIN RELIEF) 220 MG tablet Take 220 mg by mouth 2 (two) times daily with a meal.    . omeprazole (PRILOSEC) 20 MG capsule 1 PO EVERY MORNING (Patient not taking: Reported on 06/07/2017) 31 capsule 11  . tamsulosin (FLOMAX) 0.4 MG CAPS capsule Take 0.4 mg by mouth.     No current facility-administered medications on  file prior to visit.     No Known Allergies  Social History   Socioeconomic History  . Marital status: Married    Spouse name: Not on file  . Number of children: Not on file  . Years of education: Not on file  . Highest education level: Not on file  Social Needs  . Financial resource strain: Not on file  . Food insecurity - worry: Not on file  . Food insecurity - inability: Not on file  . Transportation needs - medical: Not on file  . Transportation needs - non-medical: Not on file  Occupational History  . Not on file  Tobacco Use  . Smoking status: Former Smoker    Last attempt to quit: 04/26/1989    Years since quitting: 28.3  . Smokeless tobacco: Never Used  Substance and Sexual Activity  . Alcohol use: No  . Drug use: No  . Sexual activity: Yes  Other Topics Concern  . Not on file  Social History Narrative  . Not on file    Family History  Problem Relation Age of Onset  . Colon cancer Neg Hx     Past Surgical History:  Procedure Laterality Date  . COLONOSCOPY N/A 04/26/2016   Procedure: COLONOSCOPY;  Surgeon: West BaliSandi L Fields, MD;  Location: [facility];  Service: Endoscopy;  Laterality: N/A;  1200    ROS: Review of Systems  Constitutional: Negative for activity change  and appetite change.  Respiratory: Negative for cough and shortness of breath.   Cardiovascular: Negative for chest pain.    PHYSICAL EXAM: BP 107/70   Pulse 64   Temp 97.9 F (36.6 C) (Oral)   Resp 16   Ht 5\' 9"  (1.753 m)   Wt 161 lb 3.2 oz (73.1 kg)   SpO2 100%   BMI 23.81 kg/m   Wt Readings from Last 3 Encounters:  08/25/17 161 lb 3.2 oz (73.1 kg)  06/07/17 167 lb (75.8 kg)  03/08/17 171 lb 6.4 oz (77.7 kg)    Physical Exam  General appearance - alert, well appearing, and in no distress Mental status - alert, oriented to person, place, and time, normal mood, behavior, speech, dress, motor activity, and thought processes Chest - clear to auscultation, no wheezes, rales or  rhonchi, symmetric air entry Heart - normal rate, regular rhythm, normal S1, S2, no murmurs, rubs, clicks or gallops Abdomen - soft, nontender, nondistended, no masses or organomegaly Musculoskeletal -no tenderness on palpation over the left TMJ.  However patient reports mild discomfort when he bites down Extremities - peripheral pulses normal, no pedal edema, no clubbing or cyanosis  Depression screen [facility] 2/9 08/25/2017 12/06/2016 11/08/2016  Decreased Interest 1 1 1   Down, Depressed, Hopeless (No Data) 1 1  PHQ - 2 Score 1 2 2   Altered sleeping 1 (No Data) 1  Tired, decreased energy 2 1 1   Change in appetite (No Data) (No Data) 1  Feeling bad or failure about yourself  (No Data) (No Data) 0  Trouble concentrating (No Data) 3 1  Moving slowly or fidgety/restless (No Data) (No Data) 0  Suicidal thoughts (No Data) (No Data) 0  PHQ-9 Score 4 - 6    ASSESSMENT AND PLAN: 1. Weight loss, unintentional Encourage patient to eat 3 full pills a day rather than just a couple soy milk for breakfast.  Continue regular exercise - CBC - Comprehensive metabolic panel - TSH - Hemoglobin A1c  2. Benign prostatic hyperplasia without lower urinary tract symptoms Doing well on current medications.  Followed by urology.  3. Jaw pain, non-TMJ -Doubt fracture but will x-ray. Tylenol over-the-counter as needed as needed for pain. - DG Facial Bones 1-2 Views; Future  Patient was given the opportunity to ask questions.  Patient verbalized understanding of the plan and was able to repeat key elements of the plan.   No orders of the defined types were placed in this encounter.    Requested Prescriptions    No prescriptions requested or ordered in this encounter    No Follow-up on file.  Jonah Blue, MD, FACP

## 2017-08-26 ENCOUNTER — Telehealth: Payer: Self-pay

## 2017-08-26 LAB — CBC
Hematocrit: 47.6 % (ref 37.5–51.0)
Hemoglobin: 16.1 g/dL (ref 13.0–17.7)
MCH: 30.7 pg (ref 26.6–33.0)
MCHC: 33.8 g/dL (ref 31.5–35.7)
MCV: 91 fL (ref 79–97)
PLATELETS: 230 10*3/uL (ref 150–379)
RBC: 5.25 x10E6/uL (ref 4.14–5.80)
RDW: 13.1 % (ref 12.3–15.4)
WBC: 4.5 10*3/uL (ref 3.4–10.8)

## 2017-08-26 LAB — COMPREHENSIVE METABOLIC PANEL
ALT: 21 IU/L (ref 0–44)
AST: 26 IU/L (ref 0–40)
Albumin/Globulin Ratio: 1.6 (ref 1.2–2.2)
Albumin: 4.3 g/dL (ref 3.5–5.5)
Alkaline Phosphatase: 65 IU/L (ref 39–117)
BUN/Creatinine Ratio: 18 (ref 9–20)
BUN: 14 mg/dL (ref 6–24)
Bilirubin Total: 0.4 mg/dL (ref 0.0–1.2)
CALCIUM: 9 mg/dL (ref 8.7–10.2)
CHLORIDE: 101 mmol/L (ref 96–106)
CO2: 27 mmol/L (ref 20–29)
Creatinine, Ser: 0.8 mg/dL (ref 0.76–1.27)
GFR, EST AFRICAN AMERICAN: 115 mL/min/{1.73_m2} (ref >59)
GFR, EST NON AFRICAN AMERICAN: 99 mL/min/{1.73_m2} (ref >59)
GLUCOSE: 75 mg/dL (ref 65–99)
Globulin, Total: 2.7 g/dL (ref 1.5–4.5)
Potassium: 4.5 mmol/L (ref 3.5–5.2)
Sodium: 140 mmol/L (ref 134–144)
TOTAL PROTEIN: 7 g/dL (ref 6.0–8.5)

## 2017-08-26 LAB — HEMOGLOBIN A1C
Est. average glucose Bld gHb Est-mCnc: 114 mg/dL
HEMOGLOBIN A1C: 5.6 % (ref 4.8–5.6)

## 2017-08-26 LAB — TSH: TSH: 1.66 u[IU]/mL (ref 0.450–4.500)

## 2017-08-26 NOTE — Telephone Encounter (Signed)
[employer] [place] Id# [id number] contacted pt to go over lab and xray results left a detailed vm regarding both results and if he has any questions or concerns to give me a call back at his earliest convenience  If pt calls back please give results:  no fracture seen on x-ray.   blood count, kidney and liver function tests, thyroid level were all normal. Screen for diabetes was negative.

## 2017-09-09 ENCOUNTER — Ambulatory Visit: Payer: Self-pay | Attending: Internal Medicine

## 2017-10-12 ENCOUNTER — Ambulatory Visit (INDEPENDENT_AMBULATORY_CARE_PROVIDER_SITE_OTHER): Payer: Self-pay | Admitting: Urology

## 2017-10-12 DIAGNOSIS — R351 Nocturia: Secondary | ICD-10-CM

## 2017-10-12 DIAGNOSIS — R3914 Feeling of incomplete bladder emptying: Secondary | ICD-10-CM

## 2017-10-12 DIAGNOSIS — N401 Enlarged prostate with lower urinary tract symptoms: Secondary | ICD-10-CM

## 2017-10-21 MED FILL — ?TAMSULOSIN HCL 0.4 MG CAP: 0.4 | 30 days supply | Qty: 30 | Fill #4

## 2017-10-21 MED FILL — ?FINASTERIDE 5 MG TABLET: 5 | 30 days supply | Qty: 30 | Fill #4

## 2017-11-23 ENCOUNTER — Ambulatory Visit (INDEPENDENT_AMBULATORY_CARE_PROVIDER_SITE_OTHER): Payer: Self-pay | Admitting: Urology

## 2017-11-23 DIAGNOSIS — R3914 Feeling of incomplete bladder emptying: Secondary | ICD-10-CM

## 2017-11-23 DIAGNOSIS — R351 Nocturia: Secondary | ICD-10-CM

## 2017-11-23 DIAGNOSIS — N401 Enlarged prostate with lower urinary tract symptoms: Secondary | ICD-10-CM

## 2017-11-23 MED FILL — ?FINASTERIDE 5 MG TAB: 5 | 30 days supply | Qty: 30 | Fill #0

## 2017-11-23 MED FILL — IBUPROFEN 800 MG TABLET: 800 | 10 days supply | Qty: 30 | Fill #0

## 2017-11-23 MED FILL — ?TAMSULOSIN HCL 0.4 MG CAP: 0.4 | 30 days supply | Qty: 30 | Fill #0

## 2017-11-28 ENCOUNTER — Other Ambulatory Visit: Payer: Self-pay | Admitting: Urology

## 2017-12-02 ENCOUNTER — Ambulatory Visit: Payer: Self-pay

## 2017-12-19 ENCOUNTER — Ambulatory Visit: Payer: Self-pay | Admitting: Internal Medicine

## 2017-12-27 NOTE — Patient Instructions (Addendum)
Instrucciones Para Antes de la Ciruga   Su ciruga est programada para-(your procedure is scheduled on) 01/02/2018 at 7:00 AM   [facility] Day Surgery      No coma alimentos ni tome lquidos, incluyendo agua, despus de la medianoche del     [place] estas medicinas en la manaa de la ciruga con un SORBITO de agua (take these meds the morning of surgery with a SIP of water) Flomax, Proscar   Puede cepillarse los dientes en la maana de la [place]. (you may brush your teeth the morning of surgery)   No use joyas, maquillaje de ojos, lpiz labial, crema para el cuerpo o esmalte de uas oscuro.  (Do not wear jewelry, eye makeup, lipstick, body lotion, or dark fingernail polish)   No puede usar desodorante. (you may wear deodorant)   Si va a ser ingresado despus de la ciruga, deje la maleta en el carro hasta que se le haya asignado una habitacin. (If you are to be admitted after surgery, leave suitcase in car until your room has been assigned.)   A los pacientes que se les d de alta el mismo da no se les permitir manejar a casa.  (Patients discharged on the day of surgery will not be allowed to drive home)   Use ropa suelta y cmoda de regreso a casa. (wear loose comfortable clothes for ride home)     (General Anesthesia, Adult, Care After) Estas indicaciones le proporcionan informacin acerca de cmo deber cuidarse despus del procedimiento. El mdico tambin podr darle instrucciones ms especficas. El tratamiento ha sido planificado segn las prcticas mdicas actuales, pero en algunos casos pueden ocurrir problemas. Comunquese con el mdico si tiene algn problema o dudas despus del procedimiento. QU ESPERAR DESPUS DEL PROCEDIMIENTO Despus del procedimiento, es comn [employer] siguientes sntomas:  Vmitos.  Dolor de Advertising copywriter.  Lentitud mental. Es normal sentir lo siguiente:  Nuseas.  Fro o escalofros.  Somnolencia.  Cansancio.  Dolor o inflamacin, incluso  en partes del cuerpo no afectadas por la ciruga. INSTRUCCIONES PARA EL CUIDADO EN EL HOGAR Durante al menos 24horas despus del procedimiento:  No haga lo siguiente: ? Participar en actividades que impliquen posibles cadas o lesiones. ? Conducir vehculos. ? Operar maquinarias pesadas. ? Beber alcohol. ? Tomar somnferos o medicamentos que causen somnolencia. ? Firmar documentos legales ni tomar [employer]. ? Cuidar a nios por su cuenta.  Hacer reposo. Comida y bebida  Si vomita, tome agua, jugo o sopa una vez que pueda beber sin vomitar.  Beba suficiente lquido para Photographer orina clara o de color amarillo plido.  Asegrese de no tener nuseas antes de ingerir alimentos slidos.  Siga la dieta recomendada por el mdico. Instrucciones generales  Permanezca con un adulto responsable hasta que est completamente despierto y consciente.  Retome sus actividades normales como se lo haya indicado el mdico. Pregntele al mdico qu actividades son seguras para usted.  Tome los medicamentos de venta libre y los recetados solamente como se lo haya indicado el mdico.  Si fuma, no lo haga sin supervisin.  Concurra a todas las visitas de control como se lo haya indicado el mdico. Esto es importante. SOLICITE ATENCIN MDICA SI:  Contina con nuseas o vmitos en su casa, y los medicamentos no ayudan.  No puede beber lquidos ni volver a comer.  No puede orinar despus de 8 a 12horas.  Tiene una erupcin cutnea.  Tiene fiebre.  Tiene cada vez ms enrojecimiento en la  zona de la [place]. SOLICITE ATENCIN MDICA DE INMEDIATO SI:  Tiene dificultad para respirar.  Siente dolor en el pecho.  Tiene una hemorragia imprevista.  Siente que tiene un problema potencialmente mortal o urgente. Esta informacin no tiene Theme park managercomo fin reemplazar el consejo del mdico. Asegrese de hacerle al mdico cualquier pregunta que tenga. Document Released: 06/07/2005  Document Revised: 06/28/2014 Document Reviewed: 05/22/2015 Elsevier Interactive Patient Education  2018 [employer]  Cistoscopia Cystoscopy La cistoscopia es un procedimiento que se utiliza para ayudar a Education administratordiagnosticar y, a Occupational psychologistveces, tratar afecciones que afectan las vas urinarias inferiores. Las vas urinarias inferiores estn conformadas por la vejiga y el tubo que drena la orina de la vejiga del cuerpo (uretra). La cistoscopia se realiza con un instrumento fino en forma de tubo con Neomia Dearuna luz y una cmara en el extremo (cistoscopio). El cistoscopio puede ser duro (rgido) o flexible, segn el objetivo del procedimiento.El cistoscopio se introduce en la vejiga a travs de Engineer, miningla uretra. La cistoscopia se puede recomendar en los siguientes casos:  Infecciones de las vas urinarias que se repiten (recurrentes).  Sangre en la orina (hematuria).  Prdida del control de la vejiga (incontinencia urinaria) o vejiga hiperactiva.  Clulas inusuales que se encuentran en Lauris Poaguna muestra de [place]orina.  Una obstruccin en la uretra.  Dolor al Beatrix Shipperorinar.  Una anomala en la vejiga que se encuentra durante un pielografa intravenosa (PIV) o exploracin por tomografa computarizada (TC) .  La cistoscopia tambin puede realizarse para extraer Lauris Poaguna muestra de tejido para examinarla con un microscopio (biopsia). Informe al mdico acerca de lo siguiente:  Cualquier alergia que tenga.  Todos los Walt Disneymedicamentos que utiliza, incluidos vitaminas, hierbas, gotas oftlmicas, cremas y [address]icamentos de [address] libre.  Cualquier problema previo que usted o los miembros de su familia hayan tenido con anestsicos.  Enfermedades de la sangre que tenga.  Cirugas previas.  Cualquier enfermedad que tenga.  Si est embarazada o podra estarlo. Cules son los riesgos? En general, se trata de un procedimiento seguro. Sin embargo, pueden ocurrir complicaciones, por ejemplo:  Una infeccin.  Hemorragia.  Reacciones alrgicas a los  medicamentos.  Daos a Systems developerotras estructuras u otros rganos.  Qu ocurre antes del procedimiento?  Consulte al mdico si debe hacer o no lo siguiente: ? Cambiar o suspender los medicamentos que toma habitualmente. Esto es muy importante si toma medicamentos para la diabetes o anticoagulantes. ? Tomar medicamentos como aspirina e ibuprofeno. Estos medicamentos pueden tener un efecto anticoagulante en la [place]angre. No tome estos medicamentos antes del procedimiento si el mdico le indica que no lo haga.  Siga las indicaciones del mdico respecto de las restricciones para las comidas o las bebidas.  Pueden darle antibiticos para ayudar a prevenir las infecciones.  Es posible que deba someterse a un examen o a una prueba, como radiografas de la vejiga, la uretra o los riones.  Tambin puede ser Solectron Corporationnecesario que le hagan un anlisis de orina para determinar si tiene signos de infeccin.  Haga planes para que una persona lo lleve de vuelta a su casa despus del procedimiento. Qu ocurre durante el procedimiento?  Para reducir el riesgo de infeccin, el equipo mdico se lavar o se desinfectar las manos.  Le administrarn uno o ms de los siguientes medicamentos: ? Un medicamento para ayudarlo a relajarse (sedante). ? Un medicamento para adormecer la zona (anestesia local).  Se limpiar el rea que se encuentra alrededor de la abertura de la uretra.  El cistoscopio se introducir por Engineer, miningla uretra e  ingresar a la vejiga.  Un lquido estril fluir por el cistoscopio y Probation officer vejiga. El lquido Designer, television/film set vejiga para que el cirujano pueda examinar claramente las paredes de la vejiga.  El cistoscopio se retirar y Set designer. Este procedimiento puede variar segn el mdico y el hospital. Ladell Heads ocurre despus del procedimiento?  Es posible que sienta dolor o molestias en el abdomen o la uretra. Le ofrecern medicamentos para ayudarlo.  Es posible que observe [employer].  No conduzca durante 24horas si le administraron un sedante. Esta informacin no tiene [name] el consejo del mdico. Asegrese de hacerle al mdico cualquier pregunta que tenga. Document Released: 06/07/2005 Document Revised: 09/09/2016 Document Reviewed: 04/24/2015 Elsevier Interactive Patient Education  [employer].

## 2017-12-28 ENCOUNTER — Encounter (HOSPITAL_COMMUNITY): Payer: Self-pay

## 2017-12-28 ENCOUNTER — Encounter (HOSPITAL_COMMUNITY)
Admission: RE | Admit: 2017-12-28 | Discharge: 2017-12-28 | Disposition: A | Discharge status: Home / Self Care | Payer: Self-pay | Source: Ambulatory Visit | Attending: Urology | Admitting: Urology

## 2017-12-28 ENCOUNTER — Other Ambulatory Visit: Payer: Self-pay

## 2017-12-28 DIAGNOSIS — Z01810 Encounter for preprocedural cardiovascular examination: Secondary | ICD-10-CM | POA: Insufficient documentation

## 2017-12-28 DIAGNOSIS — Z01812 Encounter for preprocedural laboratory examination: Secondary | ICD-10-CM | POA: Insufficient documentation

## 2017-12-28 LAB — CBC
HCT: 44.4 % (ref 39.0–52.0)
Hemoglobin: 14.9 g/dL (ref 13.0–17.0)
MCH: 30.6 pg (ref 26.0–34.0)
MCHC: 33.6 g/dL (ref 30.0–36.0)
MCV: 91.2 fL (ref 78.0–100.0)
PLATELETS: 238 10*3/uL (ref 150–400)
RBC: 4.87 MIL/uL (ref 4.22–5.81)
RDW: 13 % (ref 11.5–15.5)
WBC: 5 10*3/uL (ref 4.0–10.5)

## 2017-12-28 LAB — BASIC METABOLIC PANEL
Anion gap: 7 (ref 5–15)
BUN: 18 mg/dL (ref 6–20)
CALCIUM: 8.3 mg/dL — AB (ref 8.9–10.3)
CO2: 26 mmol/L (ref 22–32)
Chloride: 102 mmol/L (ref 98–111)
Creatinine, Ser: 0.69 mg/dL (ref 0.61–1.24)
GFR calc Af Amer: >60 mL/min (ref >60)
GFR calc non Af Amer: >60 mL/min (ref >60)
GLUCOSE: 81 mg/dL (ref 70–99)
Potassium: 3.8 mmol/L (ref 3.5–5.1)
Sodium: 135 mmol/L (ref 135–145)

## 2018-01-02 ENCOUNTER — Encounter (HOSPITAL_COMMUNITY): Payer: Self-pay | Admitting: *Deleted

## 2018-01-02 ENCOUNTER — Ambulatory Visit (HOSPITAL_COMMUNITY)
Admission: RE | Admit: 2018-01-02 | Discharge: 2018-01-02 | Disposition: A | Discharge status: Home / Self Care | Payer: Self-pay | Source: Ambulatory Visit | Attending: Urology | Admitting: Urology

## 2018-01-02 ENCOUNTER — Encounter (HOSPITAL_COMMUNITY)
Admission: RE | Disposition: A | Discharge status: Home / Self Care | Payer: Self-pay | Source: Ambulatory Visit | Attending: Urology

## 2018-01-02 ENCOUNTER — Ambulatory Visit (HOSPITAL_COMMUNITY): Payer: Self-pay | Admitting: Anesthesiology

## 2018-01-02 ENCOUNTER — Other Ambulatory Visit: Payer: Self-pay

## 2018-01-02 DIAGNOSIS — Z87891 Personal history of nicotine dependence: Secondary | ICD-10-CM | POA: Insufficient documentation

## 2018-01-02 DIAGNOSIS — N401 Enlarged prostate with lower urinary tract symptoms: Secondary | ICD-10-CM | POA: Insufficient documentation

## 2018-01-02 DIAGNOSIS — N320 Bladder-neck obstruction: Secondary | ICD-10-CM

## 2018-01-02 DIAGNOSIS — N138 Other obstructive and reflux uropathy: Secondary | ICD-10-CM | POA: Insufficient documentation

## 2018-01-02 HISTORY — PX: CYSTOSCOPY WITH INSERTION OF UROLIFT: SHX6678

## 2018-01-02 SURGERY — CYSTOSCOPY WITH INSERTION OF UROLIFT
Anesthesia: Monitor Anesthesia Care

## 2018-01-02 MED ORDER — LIDOCAINE HCL URETHRAL/MUCOSAL 2 % EX GEL
CUTANEOUS | Status: AC
  Filled 2018-01-02: qty 10

## 2018-01-02 MED ORDER — LACTATED RINGERS IV SOLN
INTRAVENOUS | Status: DC
  Administered 2018-01-02: 09:00:00 via INTRAVENOUS

## 2018-01-02 MED ORDER — STERILE WATER FOR IRRIGATION IR SOLN
Status: DC | PRN
  Administered 2018-01-02: 1000 mL

## 2018-01-02 MED ORDER — PROPOFOL 10 MG/ML IV BOLUS
INTRAVENOUS | Status: AC
  Filled 2018-01-02: qty 20

## 2018-01-02 MED ORDER — FENTANYL CITRATE (PF) 100 MCG/2ML IJ SOLN: 25.0000 ug | INTRAMUSCULAR | Status: DC | PRN

## 2018-01-02 MED ORDER — FENTANYL CITRATE (PF) 100 MCG/2ML IJ SOLN
INTRAMUSCULAR | Status: AC
  Filled 2018-01-02: qty 2

## 2018-01-02 MED ORDER — HYDROCODONE-ACETAMINOPHEN 7.5-325 MG PO TABS: 1.0000 | ORAL_TABLET | Freq: Once | ORAL | Status: DC | PRN

## 2018-01-02 MED ORDER — PROPOFOL 10 MG/ML IV BOLUS
INTRAVENOUS | Status: DC | PRN
  Administered 2018-01-02: 10 mg via INTRAVENOUS
  Administered 2018-01-02: 30 mg via INTRAVENOUS
  Administered 2018-01-02 (×3): 20 mg via INTRAVENOUS

## 2018-01-02 MED ORDER — OXYCODONE-ACETAMINOPHEN 5-325 MG PO TABS: 1.0000 | ORAL_TABLET | ORAL | 0 refills | Status: DC | PRN

## 2018-01-02 MED ORDER — MIDAZOLAM HCL 5 MG/5ML IJ SOLN
INTRAMUSCULAR | Status: DC | PRN
  Administered 2018-01-02 (×2): 1 mg via INTRAVENOUS

## 2018-01-02 MED ORDER — MIDAZOLAM HCL 2 MG/2ML IJ SOLN
INTRAMUSCULAR | Status: AC
  Filled 2018-01-02: qty 2

## 2018-01-02 MED ORDER — SODIUM CHLORIDE 0.9 % IR SOLN
Status: DC | PRN
  Administered 2018-01-02: 3000 mL via INTRAVESICAL

## 2018-01-02 MED ORDER — LIDOCAINE HCL URETHRAL/MUCOSAL 2 % EX GEL
CUTANEOUS | Status: DC | PRN
  Administered 2018-01-02: 1 via URETHRAL

## 2018-01-02 MED ORDER — PROPOFOL 500 MG/50ML IV EMUL
INTRAVENOUS | Status: DC | PRN
  Administered 2018-01-02: 100 ug/kg/min via INTRAVENOUS

## 2018-01-02 MED ORDER — CEFAZOLIN SODIUM-DEXTROSE 2-4 GM/100ML-% IV SOLN
2.0000 g | INTRAVENOUS | Status: AC
  Administered 2018-01-02: 2 g via INTRAVENOUS
  Filled 2018-01-02: qty 100

## 2018-01-02 MED ORDER — FENTANYL CITRATE (PF) 100 MCG/2ML IJ SOLN
INTRAMUSCULAR | Status: DC | PRN
  Administered 2018-01-02 (×2): 25 ug via INTRAVENOUS

## 2018-01-02 SURGICAL SUPPLY — 17 items
BAG DRAIN URO TABLE W/ADPT NS (BAG) ×3 IMPLANT
CLOTH BEACON ORANGE TIMEOUT ST (SAFETY) ×3 IMPLANT
GLOVE BIO SURGEON STRL SZ8 (GLOVE) ×3 IMPLANT
GLOVE BIOGEL PI IND STRL 7.0 (GLOVE) ×2 IMPLANT
GLOVE BIOGEL PI INDICATOR 7.0 (GLOVE) ×4
GLOVE ECLIPSE 6.5 STRL STRAW (GLOVE) ×3 IMPLANT
GOWN STRL REUS W/TWL LRG LVL3 (GOWN DISPOSABLE) ×3 IMPLANT
GOWN STRL REUS W/TWL XL LVL3 (GOWN DISPOSABLE) ×3 IMPLANT
IV NS IRRIG 3000ML ARTHROMATIC (IV SOLUTION) ×3 IMPLANT
KIT TURNOVER CYSTO (KITS) ×3 IMPLANT
MANIFOLD NEPTUNE II (INSTRUMENTS) ×3 IMPLANT
PACK CYSTO (CUSTOM PROCEDURE TRAY) ×3 IMPLANT
PAD ARMBOARD 7.5X6 YLW CONV (MISCELLANEOUS) ×3 IMPLANT
SYSTEM UROLIFT (Male Continence) ×12 IMPLANT
TRAY FOLEY W/BAG SLVR 16FR (SET/KITS/TRAYS/PACK) ×2
TRAY FOLEY W/BAG SLVR 16FR ST (SET/KITS/TRAYS/PACK) ×1 IMPLANT
WATER STERILE IRR 1000ML POUR (IV SOLUTION) ×3 IMPLANT

## 2018-01-02 NOTE — Anesthesia Preprocedure Evaluation (Addendum)
Anesthesia Evaluation  Patient identified by MRN, date of birth, ID band Patient awake    Reviewed: Allergy & Precautions, NPO status , Patient's Chart, lab work & pertinent test results  Airway Mallampati: I  TM Distance: >3 FB Neck ROM: Full    Dental no notable dental hx.    Pulmonary neg pulmonary ROS, former smoker,    Pulmonary exam normal breath sounds clear to auscultation       Cardiovascular Exercise Tolerance: Good negative cardio ROS Normal cardiovascular examI Rhythm:Regular Rate:Normal     Neuro/Psych negative neurological ROS  negative psych ROS   GI/Hepatic negative GI ROS, Neg liver ROS,   Endo/Other  negative endocrine ROS  Renal/GU negative Renal ROSHere for treatment of UO obstrux  negative genitourinary   Musculoskeletal negative musculoskeletal ROS (+)   Abdominal   Peds negative pediatric ROS (+)  Hematology negative hematology ROS (+)   Anesthesia Other Findings   Reproductive/Obstetrics negative OB ROS                             Anesthesia Physical Anesthesia Plan  ASA: I  Anesthesia Plan: MAC   Post-op Pain Management:    Induction: Intravenous  PONV Risk Score and Plan:   Airway Management Planned: Nasal Cannula  Additional Equipment:   Intra-op Plan:   Post-operative Plan: Extubation in OR  Informed Consent: I have reviewed the patients History and Physical, chart, labs and discussed the procedure including the risks, benefits and alternatives for the proposed anesthesia with the patient or authorized representative who has indicated his/her understanding and acceptance.   Dental advisory given  Plan Discussed with: CRNA  Anesthesia Plan Comments:        Anesthesia Quick Evaluation

## 2018-01-02 NOTE — Op Note (Addendum)
   PREOPERATIVE DIAGNOSIS: Benign prostatic hypertrophy with bladder outlet obstruction with incomplete emptying.  POSTOPERATIVE DIAGNOSIS: same  PROCEDURE: Cystoscopy with implantation of UroLift devices, 4 implants.  SURGEON: Wilkie AyePatrick Sisto Granillo, M.D.  ANESTHESIA: General  ANTIBIOTICS: ancef  SPECIMEN: None.  DRAINS: A 16-French Foley catheter.  BLOOD LOSS: Minimal.  COMPLICATIONS: None.  INDICATIONS:The Patient is an 57 year old male with BPH and bladder outlet obstruction. He has failed medical therapy and has elected UroLift for definitive treatment.  FINDINGS OF PROCEDURE: He was taken to the operating room where a genral anesthetic was induced. He was placed in lithotomy position and was fitted with PAS hose. His perineum and genitalia were prepped with chlorhexidine, and he was draped in usual sterile fashion.  Cystoscopy was performed using the UroLift scope and 0 degree lens. Examination revealed a normal urethra. The external sphincter was intact. Prostatic urethra was approximately 4 cm in length with lateral lobe enlargement. There was also little bit of bladder neck elevation. Inspection of bladder revealed mild-to-moderate trabeculation with no tumors, stones, or inflammation. No cellules or diverticula were noted. Ureteral orifices were in their normal anatomic position effluxing clear urine.  After initial cystoscopy, the visual obturator was replaced with the first UroLift device. This was turned to the 9 o'clock position and pulled back to the veru and then slightly advanced. Pressure was then applied to the right lateral lobe and the UroLift device was deployed.  The second UroLift device was then inserted and applied to the left lateral lobe at 3 o'clock and deployed in the mid prostatic urethra. After this, there was still some apparent obstruction closer to the bladder neck. So a second level of UroLift your left device was  applied between the mid urethra and the proximal urethra providing further patency to the prostatic urethra. At this point, there was mild bleeding but the patient did have a spinal anesthetic. So it was thought that a Foley catheter was indicated. The scope was removed and a 16-French Foley catheter was inserted without difficulty. The balloon was filled with 10 mL sterile fluid, and the catheter was placed to straight drainage.  COMPLICATIONS: None   CONDITION: Stable, extubated, transferred to PACU  PLAN: The patient will be discharged home and followup in 2 days for a voiding trial.

## 2018-01-02 NOTE — Discharge Instructions (Addendum)
Cistoscopia, cuidados posteriores Cystoscopy, Care After Siga estas instrucciones durante las prximas semanas. Estas indicaciones le proporcionan informacin acerca de cmo deber cuidarse despus del procedimiento. Su mdico tambin podr darle instrucciones ms especficas. El tratamiento ha sido planificado segn las prcticas mdicas actuales, pero en algunos casos pueden ocurrir problemas. Comunquese con el mdico si tiene algn problema o dudas despus del procedimiento. Qu puedo esperar despus del procedimiento? Despus del procedimiento, es comn [employer] siguientes sntomas:  Dolor leve al [name]. El dolor debe ceder unos minutos despus de [name]. Esto puede durar 1 semana.  Una pequea cantidad de sangre en la [employer].  Sentir que Immunologist produce solo una pequea cantidad [place].  Siga estas instrucciones en su casa:  Medicamentos  [employer] de venta libre y los recetados solamente como se lo haya indicado el mdico.  Si le recetaron un antibitico, tmelo como se lo haya indicado el mdico. No deje de tomar los antibiticos aunque comience a Actor. Instrucciones generales   Retome sus actividades normales como se lo haya indicado el mdico. Pregntele al mdico qu actividades son seguras para usted.  No conduzca durante 24horas si le administraron un sedante.  Observe si hay sangre en la orina. Si la cantidad de [employer] orina aumenta, comunquese con el mdico.  Siga las indicaciones del mdico respecto de las restricciones para las comidas o las bebidas.  Si se tom [place] de tejido para anlisis (biopsia) durante el procedimiento, es su responsabilidad [employer] de la prueba. Consulte a su mdico o en el departamento donde se realice el estudio cundo estarn [employer].  Beba suficiente lquido para Photographer orina clara o de color amarillo plido.  Concurra a todas las  visitas de control como se lo haya indicado el mdico. Esto es importante. Comunquese con un mdico si:  Tiene un dolor que empeora o que no mejora con los medicamentos, especialmente al [name].  Tiene dificultad para orinar. Solicite ayuda de inmediato si:  Observa ms sangre en la orina.  Hay cogulos de [employer].  Siente dolor abdominal.  Tiene fiebre o siente escalofros.  No puede orinar. Esta informacin no tiene [name] el consejo del mdico. Asegrese de hacerle al mdico cualquier pregunta que tenga. Document Released: 11/25/2009 Document Revised: 09/09/2016 Document Reviewed: 04/24/2015 Elsevier Interactive Patient Education  2018 [employer]. \ Cuidado de sonda urinaria permanente en adultos Indwelling Urinary Catheter Care, Adult Cuide bien de su sonda para que funcione de [place] correcta y para Education officer, community. Cmo usar su sonda Sujete la sonda a su pierna con cinta (cinta adhesiva) o una correa de pierna. Asegrese de no ajustarla demasiado. Si [place] cinta, quite los restos de cinta que hayan quedado sobre la sonda. Cmo usar una bolsa de drenaje Usted debera tener:  Una bolsa grande para usar de noche.  Una bolsa pequea para la pierna.  Bolsa para usar de noche La bolsa para la noche se Youth worker. Mantenga la bolsa de drenaje por debajo del nivel de la vejiga, pero mantngala separada del suelo. A la hora de dormir, coloque una bolsa de plstico limpia en una papelera. Luego, cuelgue la bolsa adentro. Bolsa para la pierna No use nunca la bolsa para la pierna durante la noche. Use siempre la bolsa de pierna por debajo de su rodilla. Mantenga la bolsa de pierna fija con una correa de pierna o cinta. Cmo  cuidar su piel  Limpie la piel que est alrededor de la sonda al menos una vez por da.  Dchese a diario. No tome baos de inmersin.  Colquese cremas, lociones o ungentos en la zona genital nicamente segn  lo indicado por su mdico.  No use talcos, aerosoles ni lociones en la zona genital. Cmo limpiar la sonda y su piel 1. Lvese las manos con agua y [place]. 2. Moje un pao con agua tibia y jabn suave. 3. Use el pao para limpiar la parte de la piel donde la sonda entra en el cuerpo. Limpie hacia abajo, alejndose de la sonda en pequeos crculos. No limpie hacia la sonda. 4. Seque bien el rea con una toalla limpia dando golpecitos. Asegrese de Massachusetts Mutual Lifeeliminar todos los restos de [place]. Cmo cuidar sus bolsas de drenaje Vace la bolsa de drenaje cuando se haya llenado hasta una tercera parte o hasta la mitad, o por lo menos 2 o 3veces al C.H. Robinson Worldwideda. Cambie la bolsa de drenaje una vez por mes o antes, si empieza a tener mal olor o est sucia. No limpie la bolsa de drenaje a menos que as se lo indique su mdico. Cmo vaciar la bolsa de drenaje  Materiales necesarios  Alcohol para fricciones.  Apsito de gasa o bola de algodn.  Cinta o correa de pierna.  Pasos 1. Lvese las manos con agua y [place]. 2. Separe (desprenda) la bolsa de su pierna. 3. Sostenga la bolsa sobre el inodoro o un recipiente limpio. Mantenga la bolsa por debajo de sus caderas y vejiga. Esto impide que el pis (orina) vuelva a Nutritional therapistentrar en la sonda. 4. Abra el pico vertedor que est en el fondo de la [place]. 5. Vace la orina en el inodoro o recipiente. No permita que el pico vertedor toque ninguna superficie. 6. Coloque alcohol para fricciones en el apsito de gasa o la bola de algodn. 7. Use el apsito de gasa o la bola de algodn para Music therapistlimpiar el pico vertedor. 8. Cierre Technical brewerel pico vertedor. 9. Sujete la bolsa a su pierna con cinta o una correa de pierna. 10. Lvese las manos.  Cmo cambiar la bolsa de drenaje Materiales necesarios  Toallitas impregnadas en alcohol.  Una bolsa de drenaje limpia.  Cinta adhesiva o correa de pierna.  Pasos 1. Lvese las manos con agua y [place]. 2. Separe la bolsa sucia de su  pierna. 3. Comprima la sonda de goma con sus dedos para que la orina no se derrame. 4. Separe la sonda del tubo de drenaje en donde se conectan (en la vlvula de conexin). No permita que la sonda o el tubo toque ninguna superficie. 5. Limpie el extremo de la sonda con un pao con alcohol. Use otro pao con alcohol para limpiar el extremo del tubo de drenaje. 6. Conecte la sonda al tubo de drenaje de la bolsa limpia. 7. Sujete la nueva bolsa a su pierna con cinta adhesiva o una correa de pierna. 8. Lvese las manos.  Cmo prevenir una infeccin y otros problemas  [place] tire de la sonda o trate de [place]itarla. Al tirar, podran daarse los tejidos de su cuerpo.  Lvese siempre las manos antes y despus de tocar la sonda.  Si se moja la correa de pierna, cmbiela por una seca.  Beba suficiente lquido para Photographermantener la orina clara o de color amarillo plido, o segn lo indique su mdico.  No permita que la bolsa de drenaje ni la sonda toquen el suelo.  Use ropa interior  de algodn.  Si es [place], higiencese desde adelante hacia atrs despus de ir de cuerpo (mover el intestino).  Revise la sonda con frecuencia para asegurarse de que funcione y que no est enroscada. Solicite ayuda si:  Su orina es turbia.  Su orina huele inusualmente mal.  Su orina no drena dentro de la bolsa.  La sonda se tapa.  La sonda empieza a tener filtraciones.  Siente que su vejiga est llena. Solicite ayuda de inmediato si:  Tiene enrojecimiento, hinchazn o dolor donde la sonda ingresa en su cuerpo.  Tiene lquido, pus o mal olor que salen del rea donde la sonda entra en su cuerpo.  El rea donde la sonda entra en su cuerpo se siente clida.  Tiene fiebre.  Siente dolor en: ? El estmago (abdomen). ? Las piernas. ? La parte inferior de la espalda. ? La vejiga.  La sonda se llena con sangre.  La orina es de color rosa o rojo.  Siente malestar estomacal (nuseas).  Vomita.  Tiene  escalofros.  La sonda se ha salido Social worker. Esta informacin no tiene [name] el consejo del mdico. Asegrese de hacerle al mdico cualquier pregunta que tenga. Document Released: 10/02/2012 Document Revised: 05/19/2016 Document Reviewed: 11/20/2013 Elsevier Interactive Patient Education  [employer].

## 2018-01-02 NOTE — H&P (Signed)
Urology Admission H&P  Chief Complaint: Urinary urgency  History of Present Illness: Mr Troy Harrison is 57yo with BPH with LUTS who has failed medical therapy. He have severe LUTS despite medical therapy.   Past Medical History:  Diagnosis Date  . Enlarged prostate    Past Surgical History:  Procedure Laterality Date  . COLONOSCOPY N/A 04/26/2016   Procedure: COLONOSCOPY;  Surgeon: West BaliSandi L Fields, MD;  Location: [facility];  Service: Endoscopy;  Laterality: N/A;  1200    Home Medications:  Current Facility-Administered Medications  Medication Dose Route Frequency Provider Last Rate Last Dose  . ceFAZolin (ANCEF) IVPB 2g/100 mL premix  2 g Intravenous 30 min Pre-Op Antaeus Karel, Mardene CelestePatrick L, MD       Allergies: No Known Allergies  Family History  Problem Relation Age of Onset  . Colon cancer Neg Hx    Social History:  reports that he quit smoking about 28 years ago. He has never used smokeless tobacco. He reports that he does not drink alcohol or use drugs.  Review of Systems  Genitourinary: Positive for frequency and urgency.  All other systems reviewed and are negative.   Physical Exam:  Vital signs in last 24 hours: Temp:  [98 F (36.7 C)] 98 F (36.7 C) (07/15 0731) Pulse Rate:  [74] 74 (07/15 0731) BP: (132)/(78) 132/78 (07/15 0731) SpO2:  [100 %] 100 % (07/15 0731) Physical Exam  Constitutional: He is oriented to person, place, and time. He appears well-developed and well-nourished.  HENT:  Head: Normocephalic and atraumatic.  Eyes: Pupils are equal, round, and reactive to light. EOM are normal.  Neck: Normal range of motion. No thyromegaly present.  Cardiovascular: Normal rate and regular rhythm.  Respiratory: Effort normal. No respiratory distress.  GI: Soft. He exhibits no distension.  Musculoskeletal: Normal range of motion. He exhibits no edema.  Neurological: He is alert and oriented to person, place, and time.  Skin: Skin is warm and dry.  Psychiatric: He has  a normal mood and affect. His behavior is normal. Judgment and thought content normal.    Laboratory Data:  No results found for this or any previous visit (from the past 24 hour(s)). No results found for this or any previous visit (from the past 240 hour(s)). Creatinine: Recent Labs    12/28/17 1230  CREATININE 0.69   Baseline Creatinine: 0.7  Impression/Assessment:  56yo with BPH with LUTS, urinary urgency  Plan:  The risks/benefits/alternatives to urolift was explained to the patient and he understands and wishes to proceed with surgery  Wilkie AyePatrick Apphia Cropley 01/02/2018, 8:32 AM

## 2018-01-02 NOTE — Anesthesia Postprocedure Evaluation (Signed)
Anesthesia Post Note  Patient: Troy Harrison  Procedure(s) Performed: CYSTOSCOPY WITH INSERTION OF UROLIFT (N/A )  Patient location during evaluation: PACU Anesthesia Type: MAC Level of consciousness: awake and alert and patient cooperative Pain management: satisfactory to patient Vital Signs Assessment: post-procedure vital signs reviewed and stable Respiratory status: spontaneous breathing Cardiovascular status: stable Postop Assessment: no apparent nausea or vomiting Anesthetic complications: no     Last Vitals:  Vitals:   01/02/18 0731 01/02/18 0938  BP: 132/78   Pulse: 74   Temp: 36.7 C 36.6 C  SpO2: 100%     Last Pain:  Vitals:   01/02/18 0938  TempSrc:   PainSc: 3                  Makinley Muscato

## 2018-01-02 NOTE — Transfer of Care (Signed)
Immediate Anesthesia Transfer of Care Note  Patient: Troy Harrison  Procedure(s) Performed: CYSTOSCOPY WITH INSERTION OF UROLIFT (N/A )  Patient Location: PACU  Anesthesia Type:MAC  Level of Consciousness: awake, alert  and oriented  Airway & Oxygen Therapy: Patient Spontanous Breathing  Post-op Assessment: Report given to RN  Post vital signs: Reviewed and stable  Last Vitals:  Vitals Value Taken Time  BP    Temp    Pulse 81 01/02/2018  9:38 AM  Resp 13 01/02/2018  9:38 AM  SpO2 99 % 01/02/2018  9:38 AM  Vitals shown include unvalidated device data.  Last Pain:  Vitals:   01/02/18 0731  TempSrc: Oral  PainSc: 0-No pain         Complications: No apparent anesthesia complications

## 2018-01-03 ENCOUNTER — Encounter (HOSPITAL_COMMUNITY): Payer: Self-pay | Admitting: Urology

## 2018-01-04 ENCOUNTER — Ambulatory Visit (INDEPENDENT_AMBULATORY_CARE_PROVIDER_SITE_OTHER): Payer: Self-pay | Admitting: Urology

## 2018-01-04 DIAGNOSIS — N401 Enlarged prostate with lower urinary tract symptoms: Secondary | ICD-10-CM

## 2018-01-04 MED FILL — ?FINASTERIDE 5 MG TAB: 5 | 30 days supply | Qty: 30 | Fill #0

## 2018-01-18 ENCOUNTER — Ambulatory Visit (INDEPENDENT_AMBULATORY_CARE_PROVIDER_SITE_OTHER): Payer: Self-pay | Admitting: Urology

## 2018-01-18 ENCOUNTER — Other Ambulatory Visit (HOSPITAL_COMMUNITY)
Admission: RE | Admit: 2018-01-18 | Discharge: 2018-01-18 | Disposition: A | Discharge status: Home / Self Care | Payer: Self-pay | Source: Ambulatory Visit | Attending: Urology | Admitting: Urology

## 2018-01-18 DIAGNOSIS — N3000 Acute cystitis without hematuria: Secondary | ICD-10-CM

## 2018-01-18 DIAGNOSIS — R3914 Feeling of incomplete bladder emptying: Secondary | ICD-10-CM

## 2018-01-18 DIAGNOSIS — N401 Enlarged prostate with lower urinary tract symptoms: Secondary | ICD-10-CM

## 2018-01-18 DIAGNOSIS — R351 Nocturia: Secondary | ICD-10-CM

## 2018-01-18 MED FILL — SULFAMETHOXAZOLE-TMP DS TAB: 800-160 | 7 days supply | Qty: 14 | Fill #0

## 2018-01-19 LAB — URINE CULTURE: Culture: NO GROWTH

## 2018-01-23 ENCOUNTER — Ambulatory Visit: Payer: Self-pay | Attending: Internal Medicine | Admitting: Internal Medicine

## 2018-01-23 ENCOUNTER — Encounter: Payer: Self-pay | Admitting: Internal Medicine

## 2018-01-23 VITALS — BP 134/75 | HR 67 | Temp 98.2°F | Resp 16 | Wt 163.2 lb

## 2018-01-23 DIAGNOSIS — N3000 Acute cystitis without hematuria: Secondary | ICD-10-CM | POA: Insufficient documentation

## 2018-01-23 DIAGNOSIS — M4184 Other forms of scoliosis, thoracic region: Secondary | ICD-10-CM | POA: Insufficient documentation

## 2018-01-23 DIAGNOSIS — Z79899 Other long term (current) drug therapy: Secondary | ICD-10-CM | POA: Insufficient documentation

## 2018-01-23 DIAGNOSIS — Z87891 Personal history of nicotine dependence: Secondary | ICD-10-CM | POA: Insufficient documentation

## 2018-01-23 DIAGNOSIS — M47894 Other spondylosis, thoracic region: Secondary | ICD-10-CM | POA: Insufficient documentation

## 2018-01-23 DIAGNOSIS — N400 Enlarged prostate without lower urinary tract symptoms: Secondary | ICD-10-CM | POA: Insufficient documentation

## 2018-01-23 DIAGNOSIS — M546 Pain in thoracic spine: Secondary | ICD-10-CM | POA: Insufficient documentation

## 2018-01-23 DIAGNOSIS — G8929 Other chronic pain: Secondary | ICD-10-CM | POA: Insufficient documentation

## 2018-01-23 DIAGNOSIS — I10 Essential (primary) hypertension: Secondary | ICD-10-CM | POA: Insufficient documentation

## 2018-01-23 DIAGNOSIS — E559 Vitamin D deficiency, unspecified: Secondary | ICD-10-CM | POA: Insufficient documentation

## 2018-01-23 DIAGNOSIS — L299 Pruritus, unspecified: Secondary | ICD-10-CM | POA: Insufficient documentation

## 2018-01-23 DIAGNOSIS — I861 Scrotal varices: Secondary | ICD-10-CM | POA: Insufficient documentation

## 2018-01-23 MED ORDER — LORATADINE 10 MG PO TABS: 10.0000 mg | ORAL_TABLET | Freq: Every day | ORAL | 0 refills | Status: DC | PRN

## 2018-01-23 MED ORDER — CIPROFLOXACIN HCL 500 MG PO TABS: 500.0000 mg | ORAL_TABLET | Freq: Two times a day (BID) | ORAL | 0 refills | Status: DC

## 2018-01-23 MED FILL — ?LORATADINE 10 MG TABS: 10 | 30 days supply | Qty: 30 | Fill #0

## 2018-01-23 MED FILL — CIPROFLOXACIN HCL 500 MG TA: 500 | 5 days supply | Qty: 10 | Fill #0

## 2018-01-23 NOTE — Progress Notes (Signed)
Pt states he has itchiness on his abdomen

## 2018-01-23 NOTE — Progress Notes (Signed)
Patient ID: Troy Harrison, male    DOB: 1960/10/24  MRN: [id number]  CC: Follow-up   Subjective: Troy Harrison is a 57 y.o. male who presents for chronic ds management. 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  BPH:  Had cystoscopy with implant of Urolift device 01/02/2018 by Dr. Ronne BinningMcKenzie. Has seen Dr. Ronne BinningMcKenzie in f/u since then.  Urinating better.   C/o feeling of itchiness over the body x 1 wk. he is currently on Bactrim that was prescribed by his urologist for UTI.  Itching started the day after he started taking the medication.  He has about 2 days left.  Reports some mild suprapubic tenderness but no dysuria -no other family member with itching. -using a new home made under arm deodorant -also noticed some black spots on dorsum LT foot    Patient Active Problem List   Diagnosis Date Noted  . Weight loss, unintentional 08/25/2017  . 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 Chronic midline thoracic back pain 03/08/2017  . Benign prostatic hyperplasia with urinary frequency 12/06/2016  . Varicocele present on ultrasound of scrotum 12/06/2016  . Vitamin D deficiency 12/06/2016  . HTN (hypertension), benign 11/08/2016  . Constipation 12/17/2013  . Hemorrhoids, external without complications 12/17/2013  . Special screening for malignant neoplasms, colon 12/17/2013     Current Outpatient Medications on File Prior to Visit  Medication Sig Dispense Refill  . finasteride (PROSCAR) 5 MG tablet Take 5 mg by mouth at bedtime.   3   No current facility-administered medications on file prior to visit.     No Known Allergies  Social History   Socioeconomic History  . Marital status: Married    Spouse name: Not on file  . Number of children: Not on file  . Years of education: Not on file  . Highest education level: Not on  file  Occupational History  . Not on file  Social Needs  . Financial resource strain: Not on file  . Food insecurity:    Worry: Not on file    Inability: Not on file  . Transportation needs:    Medical: Not on file    Non-medical: Not on file  Tobacco Use  . Smoking status: Former Smoker    Last attempt to quit: 04/26/1989    Years since quitting: 28.7  . Smokeless tobacco: Never Used  Substance and Sexual Activity  . Alcohol use: No  . Drug use: No  . Sexual activity: Yes  Lifestyle  . Physical activity:    Days per week: Not on file    Minutes per session: Not on file  . Stress: Not on file  Relationships  . Social connections:    Talks on phone: Not on file    Gets together: Not on file    Attends religious service: Not on file    Active member of club or organization: Not on file    Attends meetings of clubs or organizations: Not on file    Relationship status: Not on file  . Intimate partner violence:    Fear of current or ex partner: Not on file    Emotionally abused: Not on file    Physically abused: Not on file    Forced sexual activity: Not on file  Other Topics Concern  . Not on file  Social History Narrative  . Not on file    Family  History  Problem Relation Age of Onset  . Colon cancer Neg Hx     Past Surgical History:  Procedure Laterality Date  . COLONOSCOPY N/A 04/26/2016   Procedure: COLONOSCOPY;  Surgeon: West Bali, MD;  Location: [facility];  Service: Endoscopy;  Laterality: N/A;  1200  . CYSTOSCOPY WITH INSERTION OF UROLIFT N/A 01/02/2018   Procedure: CYSTOSCOPY WITH INSERTION OF UROLIFT;  Surgeon: Malen Gauze, MD;  Location: [facility];  Service: Urology;  Laterality: N/A;    ROS: Review of Systems Negative except as stated above  PHYSICAL EXAM: BP 134/75   Pulse 67   Temp 98.2 F (36.8 C) (Oral)   Resp 16   Wt 163 lb 3.2 oz (74 kg)   SpO2 100%   BMI 24.10 kg/m   Wt Readings from Last 3 Encounters:  01/23/18 163 lb  3.2 oz (74 kg)  08/25/17 161 lb 3.2 oz (73.1 kg)  06/07/17 167 lb (75.8 kg)   Physical Exam  General appearance - alert, well appearing, and in no distress Mental status - normal mood, behavior, speech, dress, motor activity, and thought processes Mouth - mucous membranes moist, pharynx normal without lesions Neck - supple, no significant adenopathy Chest - clear to auscultation, no wheezes, rales or rhonchi, symmetric air entry Heart - normal rate, regular rhythm, normal S1, S2, no murmurs, rubs, clicks or gallops Extremities - peripheral pulses normal, no pedal edema, no clubbing or cyanosis Skin: The black spots that patient is concerned about on his left foot are actually small spider veins. No rash seen on the body at this time. Lab Results  Component Value Date   WBC 5.0 12/28/2017   HGB 14.9 12/28/2017   HCT 44.4 12/28/2017   MCV 91.2 12/28/2017   PLT 238 12/28/2017     Chemistry      Component Value Date/Time   NA 135 12/28/2017 1230   NA 140 08/25/2017 1231   K 3.8 12/28/2017 1230   CL 102 12/28/2017 1230   CO2 26 12/28/2017 1230   BUN 18 12/28/2017 1230   BUN 14 08/25/2017 1231   CREATININE 0.69 12/28/2017 1230   CREATININE 0.78 03/03/2016 1015      Component Value Date/Time   CALCIUM 8.3 (L) 12/28/2017 1230   ALKPHOS 65 08/25/2017 1231   AST 26 08/25/2017 1231   ALT 21 08/25/2017 1231   BILITOT 0.4 08/25/2017 1231     Lab Results  Component Value Date   CHOL 189 11/08/2016   HDL 93 11/08/2016   LDLCALC 82 11/08/2016   TRIG 72 11/08/2016   CHOLHDL 2.0 11/08/2016   ASSESSMENT AND PLAN: 1. Itching Likely due to Bactrim.  Advised stopping the medication.  2. Benign prostatic hyperplasia without lower urinary tract symptoms Recent urologic procedure with improvement in his symptoms.  3. Acute cystitis without hematuria Stop Bactrim.  Start Cipro instead. - ciprofloxacin (CIPRO) 500 MG tablet; Take 1 tablet (500 mg total) by mouth 2 (two) times daily.   Dispense: 10 tablet; Refill: 0   Patient was given the opportunity to ask questions.  Patient verbalized understanding of the plan and was able to repeat key elements of the plan.  [employer] interpreter used during this encounter. # [id number]   No orders of the defined types were placed in this encounter.    Requested Prescriptions   Signed Prescriptions Disp Refills  . ciprofloxacin (CIPRO) 500 MG tablet 10 tablet 0    Sig: Take 1 tablet (500 mg total)  by mouth 2 (two) times daily.    Return in about 5 months (around 06/25/2018).  Jonah Blue, MD, FACP

## 2018-02-03 ENCOUNTER — Ambulatory Visit (HOSPITAL_COMMUNITY)
Admission: EM | Admit: 2018-02-03 | Discharge: 2018-02-03 | Disposition: A | Discharge status: Home / Self Care | Payer: Self-pay | Attending: Emergency Medicine | Admitting: Emergency Medicine

## 2018-02-03 ENCOUNTER — Encounter (HOSPITAL_COMMUNITY): Payer: Self-pay | Admitting: Emergency Medicine

## 2018-02-03 DIAGNOSIS — S61311A Laceration without foreign body of left index finger with damage to nail, initial encounter: Secondary | ICD-10-CM

## 2018-02-03 DIAGNOSIS — Z23 Encounter for immunization: Secondary | ICD-10-CM

## 2018-02-03 DIAGNOSIS — W268XXA Contact with other sharp object(s), not elsewhere classified, initial encounter: Secondary | ICD-10-CM

## 2018-02-03 MED ORDER — TETANUS-DIPHTHERIA TOXOIDS TD 5-2 LFU IM INJ: 0.5000 mL | INJECTION | Freq: Once | INTRAMUSCULAR | Status: DC

## 2018-02-03 MED ORDER — TETANUS-DIPHTH-ACELL PERTUSSIS 5-2.5-18.5 LF-MCG/0.5 IM SUSP
INTRAMUSCULAR | Status: AC
  Filled 2018-02-03: qty 0.5

## 2018-02-03 MED ORDER — TETANUS-DIPHTH-ACELL PERTUSSIS 5-2.5-18.5 LF-MCG/0.5 IM SUSP
0.5000 mL | Freq: Once | INTRAMUSCULAR | Status: AC
  Administered 2018-02-03: 0.5 mL via INTRAMUSCULAR

## 2018-02-03 NOTE — Discharge Instructions (Addendum)
Nothing to eat by mouth until after you are seen by the surgeon.

## 2018-02-03 NOTE — ED Triage Notes (Signed)
With spanish interpreter. "I cut my finger and I need a tetanus shot and for it to be cleaned up". Pt states he was cutting the grass and the blade cut his finger. Happened last Saturday.

## 2018-02-03 NOTE — ED Provider Notes (Signed)
[facility]    CSN: [id number] Arrival date & time: 02/03/18  1117     History   Chief Complaint Chief Complaint  Patient presents with  . Finger Injury    HPI Troy Harrison is a 57 y.o. male.   He was cutting his lawn when the lawnmower stopped. He lifted the machine with his right hand, and his left index finger was cut. He has been using a bandage, hydrogen peroxide, and a powder to treat it. He is right-handed and works in a [employer].  The history is provided by the patient. A language interpreter was used.  Hand Pain  This is a new problem. The current episode started more than 2 days ago (6 days). The problem occurs constantly. The problem has been gradually worsening. Pertinent negatives include no chest pain, no abdominal pain, no headaches and no shortness of breath. Associated symptoms comments: Numbness on the tip. The symptoms are aggravated by bending. Nothing relieves the symptoms. He has tried water for the symptoms. The treatment provided no relief.    Past Medical History:  Diagnosis Date  . Enlarged prostate     Patient Active Problem List   Diagnosis Date Noted  . Weight loss, unintentional 08/25/2017  . 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 Chronic midline thoracic back pain 03/08/2017  . Benign prostatic hyperplasia with urinary frequency 12/06/2016  . Varicocele present on ultrasound of scrotum 12/06/2016  . Vitamin D deficiency 12/06/2016  . HTN (hypertension), benign 11/08/2016  . Constipation 12/17/2013  . Hemorrhoids, external without complications 12/17/2013  . Special screening for malignant neoplasms, colon 12/17/2013    Past Surgical History:  Procedure Laterality Date  . COLONOSCOPY N/A 04/26/2016   Procedure: COLONOSCOPY;  Surgeon: West BaliSandi L Fields, MD;  Location: [facility];  Service: Endoscopy;  Laterality: N/A;  1200  . CYSTOSCOPY WITH INSERTION OF UROLIFT N/A 01/02/2018   Procedure: CYSTOSCOPY  WITH INSERTION OF UROLIFT;  Surgeon: Malen GauzeMcKenzie, Patrick L, MD;  Location: [facility];  Service: Urology;  Laterality: N/A;       Home Medications    Prior to Admission medications   Medication Sig Start Date End Date Taking? Authorizing Provider  ciprofloxacin (CIPRO) 500 MG tablet Take 1 tablet (500 mg total) by mouth 2 (two) times daily. 01/23/18   Marcine MatarJohnson, Deborah B, MD  finasteride (PROSCAR) 5 MG tablet Take 5 mg by mouth at bedtime.  05/25/17   [provider]    Family History Family History  Problem Relation Age of Onset  . Colon cancer Neg Hx     Social History Social History   Tobacco Use  . Smoking status: Former Smoker    Last attempt to quit: 04/26/1989    Years since quitting: 28.7  . Smokeless tobacco: Never Used  Substance Use Topics  . Alcohol use: No  . Drug use: No     Allergies   Bactrim [sulfamethoxazole-trimethoprim]   Review of Systems Review of Systems  Constitutional: Negative for chills and fever.  HENT: Negative for ear pain and sore throat.   Eyes: Negative for pain and visual disturbance.  Respiratory: Negative for cough and shortness of breath.   Cardiovascular: Negative for chest pain and palpitations.  Gastrointestinal: Negative for abdominal pain and vomiting.  Genitourinary: Negative for dysuria and hematuria.  Musculoskeletal: Negative for arthralgias and back pain.       Finger pain  Skin: Positive for wound. Negative for color change and rash.  Neurological: Negative  for seizures, syncope and headaches.  All other systems reviewed and are negative.    Physical Exam Triage Vital Signs ED Triage Vitals [02/03/18 1121]  Enc Vitals Group     BP 116/61     Pulse Rate 70     Resp 18     Temp (!) 97.5 F (36.4 C)     Temp src      SpO2 100 %     Weight      Height      Head Circumference      Peak Flow      Pain Score      Pain Loc      Pain Edu?      Excl. in GC?    No data found.  Updated Vital Signs BP  116/61   Pulse 70   Temp (!) 97.5 F (36.4 C)   Resp 18   SpO2 100%   Visual Acuity Right Eye Distance:   Left Eye Distance:   Bilateral Distance:    Right Eye Near:   Left Eye Near:    Bilateral Near:     Physical Exam  Constitutional: He is oriented to person, place, and time. He appears well-developed and well-nourished. No distress.  HENT:  Head: Normocephalic and atraumatic.  Eyes: Conjunctivae and EOM are normal.  Pulmonary/Chest: Effort normal. No respiratory distress.  Musculoskeletal:  Mild, diffuse erythema of the left index finger distal phalanx. Mild hypoesthesia over the palmar aspect of the distal phalanx. Capillary refill normal. No joint effusion. No tendon injury.  Neurological: He is alert and oriented to person, place, and time.  Skin: He is not diaphoretic.  There is an avulsion injury of the left index finger with 25% of the upper aspect of the ulnar side of the nail gone. There is necrosis and fluctuance over the tip of the finger and tenderness around the entire distal phalanx.     UC Treatments / Results  Labs (all labs ordered are listed, but only abnormal results are displayed) Labs Reviewed - No data to display  EKG None  Radiology No results found.  Procedures Procedures (including critical care time)  Medications Ordered in UC Medications - No data to display  Initial Impression / Assessment and Plan / UC Course  I have reviewed the triage vital signs and the nursing notes.  Pertinent labs & imaging results that were available during my care of the patient were reviewed by me and considered in my medical decision making (see chart for details).     Hand Surgery called. He will be seen today at 1pm in the office. Rule-out felon. Final Clinical Impressions(s) / UC Diagnoses   Final diagnoses:  Laceration of left index finger without foreign body with damage to nail, initial encounter     Discharge Instructions     Nothing to  eat by mouth until after you are seen by the surgeon.    ED Prescriptions    None     Controlled Substance Prescriptions [place] Controlled Substance Registry consulted? Not Applicable   Marshell LevanMonroe, Anna G, MD 02/03/18 1208

## 2018-02-08 ENCOUNTER — Ambulatory Visit (INDEPENDENT_AMBULATORY_CARE_PROVIDER_SITE_OTHER): Payer: Self-pay | Admitting: Urology

## 2018-02-08 DIAGNOSIS — N401 Enlarged prostate with lower urinary tract symptoms: Secondary | ICD-10-CM

## 2018-02-08 DIAGNOSIS — R351 Nocturia: Secondary | ICD-10-CM

## 2018-02-08 DIAGNOSIS — R3914 Feeling of incomplete bladder emptying: Secondary | ICD-10-CM

## 2018-03-08 ENCOUNTER — Ambulatory Visit: Payer: Self-pay | Attending: Internal Medicine | Admitting: Physician Assistant

## 2018-03-08 VITALS — BP 127/74 | HR 70 | Temp 97.7°F | Resp 16 | Wt 163.0 lb

## 2018-03-08 DIAGNOSIS — R682 Dry mouth, unspecified: Secondary | ICD-10-CM | POA: Insufficient documentation

## 2018-03-08 DIAGNOSIS — E559 Vitamin D deficiency, unspecified: Secondary | ICD-10-CM | POA: Insufficient documentation

## 2018-03-08 DIAGNOSIS — G8929 Other chronic pain: Secondary | ICD-10-CM | POA: Insufficient documentation

## 2018-03-08 DIAGNOSIS — I861 Scrotal varices: Secondary | ICD-10-CM | POA: Insufficient documentation

## 2018-03-08 DIAGNOSIS — I10 Essential (primary) hypertension: Secondary | ICD-10-CM | POA: Insufficient documentation

## 2018-03-08 DIAGNOSIS — M549 Dorsalgia, unspecified: Secondary | ICD-10-CM | POA: Insufficient documentation

## 2018-03-08 DIAGNOSIS — R202 Paresthesia of skin: Secondary | ICD-10-CM

## 2018-03-08 DIAGNOSIS — N400 Enlarged prostate without lower urinary tract symptoms: Secondary | ICD-10-CM | POA: Insufficient documentation

## 2018-03-08 DIAGNOSIS — Z79899 Other long term (current) drug therapy: Secondary | ICD-10-CM | POA: Insufficient documentation

## 2018-03-08 DIAGNOSIS — Z9189 Other specified personal risk factors, not elsewhere classified: Secondary | ICD-10-CM

## 2018-03-08 NOTE — Progress Notes (Signed)
Chief Complaint: Multiple complaints  Subjective: This is a 57 year old Spanish-speaking male with a history of hypertension, chronic back pain, vitamin D deficiency, BPH and varicocele who is here for what was scheduled to be a hospital follow-up but instead he has multiple complaints about multiple chronic issues.  I have redirected him and told him that some of these things need to be addressed by his primary care provider at his next visit.    Acutely he would like to talk about some dry mouth sensation that he is been experiencing for the last several weeks.  He also complains of tingling in bilateral feet.  At some point in the last few weeks he was on ciprofloxacin and he thought maybe he was having a reaction to this.  He has since discontinued the medication is still having the above symptoms.  He wants to know if his liver has been affected by the antibiotic?!   ROS:  GEN: denies fever or chills, denies change in weight Skin: denies lesions or rashes HEENT: denies headache, earache, epistaxis, sore throat, or neck pain LUNGS: denies SHOB, dyspnea, PND, orthopnea CV: denies CP or palpitations ABD: +abd pain, no N or V EXT: denies muscle spasms or swelling; no pain in lower ext, no weakness NEURO: + numbness or tingling, denies sz, stroke or TIA   Objective:  Vitals:   03/08/18 0903  BP: 127/74  Pulse: 70  Resp: 16  Temp: 97.7 F (36.5 C)  TempSrc: Oral  SpO2: 100%  Weight: 163 lb (73.9 kg)    Physical Exam:  General: in no acute distress. HEENT: no pallor, no icterus, moist oral mucosa, no JVD, no lymphadenopathy Heart: Normal  s1 &s2  Regular rate and rhythm, without murmurs, rubs, gallops. Lungs: Clear to auscultation bilaterally. Abdomen: Soft, nontender, nondistended, positive bowel sounds. Extremities: No clubbing cyanosis or edema with positive pedal pulses. Neuro: Alert, awake, oriented x3, nonfocal.   Medications: Prior to Admission medications    Medication Sig Start Date End Date Taking? Authorizing Provider  finasteride (PROSCAR) 5 MG tablet Take 5 mg by mouth at bedtime.  05/25/17  Yes [provider]  ciprofloxacin (CIPRO) 500 MG tablet Take 1 tablet (500 mg total) by mouth 2 (two) times daily. Patient not taking: Reported on 03/08/2018 01/23/18   Marcine MatarJohnson, Deborah B, MD    Assessment: 1. Dry Mouth 2. Tingling in Feet   Plan: Check CMP to address liver concern and glucose Consider Neurontin if sxs persist Adequate hydration  Follow ZO:XWRUup:ASAP with Dr. Laural BenesJohnson for multi complaints  The patient was given clear instructions to go to ER or return to medical center if symptoms don't improve, worsen or new problems develop. The patient verbalized understanding. The patient was told to call to get lab results if they haven't heard anything in the next week.   This note has been created with Education officer, [employer]Dragon speech recognition software and smart phrase technology. Any transcriptional errors are unintentional.   Scot Juniffany Mikeyla Music, PA-C 03/08/2018, 11:15 AM

## 2018-03-09 LAB — CMP AND LIVER
ALBUMIN: 4.5 g/dL (ref 3.5–5.5)
ALT: 25 IU/L (ref 0–44)
AST: 24 IU/L (ref 0–40)
Alkaline Phosphatase: 66 IU/L (ref 39–117)
BILIRUBIN TOTAL: 0.4 mg/dL (ref 0.0–1.2)
BUN: 15 mg/dL (ref 6–24)
Bilirubin, Direct: 0.12 mg/dL (ref 0.00–0.40)
CALCIUM: 9.3 mg/dL (ref 8.7–10.2)
CHLORIDE: 99 mmol/L (ref 96–106)
CO2: 26 mmol/L (ref 20–29)
Creatinine, Ser: 0.85 mg/dL (ref 0.76–1.27)
GFR, EST AFRICAN AMERICAN: 111 mL/min/{1.73_m2} (ref >59)
GFR, EST NON AFRICAN AMERICAN: 96 mL/min/{1.73_m2} (ref >59)
GLUCOSE: 90 mg/dL (ref 65–99)
POTASSIUM: 4.6 mmol/L (ref 3.5–5.2)
SODIUM: 141 mmol/L (ref 134–144)
Total Protein: 6.6 g/dL (ref 6.0–8.5)

## 2018-03-10 ENCOUNTER — Telehealth: Payer: Self-pay | Admitting: *Deleted

## 2018-03-10 NOTE — Telephone Encounter (Signed)
Left message on voicemail to return call. Interpreter service [facility] by [facility]- ID number: [id number]: [id number] Notes recorded by Vivianne MasterNoel, Tiffany S, PA-C on 03/10/2018 at 10:38 AM EDT pls reassure him that his liver function and other labs were all normal pls and thx!

## 2018-04-03 ENCOUNTER — Encounter: Payer: Self-pay | Admitting: Internal Medicine

## 2018-04-03 ENCOUNTER — Ambulatory Visit: Payer: Self-pay | Attending: Internal Medicine | Admitting: Internal Medicine

## 2018-04-03 VITALS — BP 132/81 | HR 67 | Temp 97.8°F | Resp 16 | Wt 163.8 lb

## 2018-04-03 DIAGNOSIS — Z882 Allergy status to sulfonamides status: Secondary | ICD-10-CM | POA: Insufficient documentation

## 2018-04-03 DIAGNOSIS — R8290 Unspecified abnormal findings in urine: Secondary | ICD-10-CM

## 2018-04-03 DIAGNOSIS — Z9889 Other specified postprocedural states: Secondary | ICD-10-CM | POA: Insufficient documentation

## 2018-04-03 DIAGNOSIS — R82998 Other abnormal findings in urine: Secondary | ICD-10-CM | POA: Insufficient documentation

## 2018-04-03 DIAGNOSIS — Z87891 Personal history of nicotine dependence: Secondary | ICD-10-CM | POA: Insufficient documentation

## 2018-04-03 DIAGNOSIS — R110 Nausea: Secondary | ICD-10-CM | POA: Insufficient documentation

## 2018-04-03 DIAGNOSIS — I10 Essential (primary) hypertension: Secondary | ICD-10-CM | POA: Insufficient documentation

## 2018-04-03 DIAGNOSIS — R682 Dry mouth, unspecified: Secondary | ICD-10-CM | POA: Insufficient documentation

## 2018-04-03 LAB — POCT URINALYSIS DIP (CLINITEK)
BILIRUBIN UA: NEGATIVE mg/dL
Bilirubin, UA: NEGATIVE
GLUCOSE UA: NEGATIVE mg/dL
Leukocytes, UA: NEGATIVE
Nitrite, UA: NEGATIVE
POC,PROTEIN,UA: NEGATIVE
SPEC GRAV UA: 1.015 (ref 1.010–1.025)
UROBILINOGEN UA: 0.2 U/dL
pH, UA: 6.5 (ref 5.0–8.0)

## 2018-04-03 MED ORDER — PROMETHAZINE HCL 12.5 MG PO TABS: 12.5000 mg | ORAL_TABLET | Freq: Three times a day (TID) | ORAL | 0 refills | Status: DC | PRN

## 2018-04-03 MED FILL — PROMETHAZINE 12.5 MG TABLET: 12.5 | 5 days supply | Qty: 15 | Fill #0

## 2018-04-03 NOTE — Progress Notes (Signed)
Pt states he is having problems with his colon  Pt states he is having nausea  Pt states his urine has a foul odor

## 2018-04-03 NOTE — Progress Notes (Signed)
Patient ID: Troy Harrison, male    DOB: April 07, 1961  MRN: [id number]  CC: UC visit.  Subjective: Troy Harrison is a 57 y.o. male who presents for UC. His concerns today include:  57 year old male with history of HTN(control offmed), chronic midline thoracic back pain(OA and scoliosis), vitamin D deficiency,BPHand varicocele.  Pt c/o having "smelly and really yellow urine" since July.  Started after cystoscopy in July.  No dysuria or hematuria.  Wants to know if there is a pill that can take the "smell away."  No longer on Proscar. Reports his urologist took him off it.   C/o intermittent nausea for past 3 days.  Lasts about 10 mins. No association with meals -no abdominal pain,or diarrhea.  Little constipation with last BM yesterday afternoon.  No blood in the stools -no HA.   C/o dry mouth that also started after cystoscopy.  -worse at nights.  During the day "I just have to constantly spit" because there is a bad taste in my mouth.  -no dry eyes.     Patient Active Problem List   Diagnosis Date Noted  . Weight loss, unintentional 08/25/2017  . 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 Chronic midline thoracic back pain 03/08/2017  . Benign prostatic hyperplasia with urinary frequency 12/06/2016  . Varicocele present on ultrasound of scrotum 12/06/2016  . Vitamin D deficiency 12/06/2016  . HTN (hypertension), benign 11/08/2016  . Constipation 12/17/2013  . Hemorrhoids, external without complications 12/17/2013  . Special screening for malignant neoplasms, colon 12/17/2013     No current outpatient medications on file prior to visit.   No current facility-administered medications on file prior to visit.     Allergies  Allergen Reactions  . Bactrim [Sulfamethoxazole-Trimethoprim] Itching    Social History   Socioeconomic History  . Marital status: Married    Spouse name: Not on file  . Number of children: Not on file  . Years of education: Not on  file  . Highest education level: Not on file  Occupational History  . Not on file  Social Needs  . Financial resource strain: Not on file  . Food insecurity:    Worry: Not on file    Inability: Not on file  . Transportation needs:    Medical: Not on file    Non-medical: Not on file  Tobacco Use  . Smoking status: Former Smoker    Last attempt to quit: 04/26/1989    Years since quitting: 28.9  . Smokeless tobacco: Never Used  Substance and Sexual Activity  . Alcohol use: No  . Drug use: No  . Sexual activity: Yes  Lifestyle  . Physical activity:    Days per week: Not on file    Minutes per session: Not on file  . Stress: Not on file  Relationships  . Social connections:    Talks on phone: Not on file    Gets together: Not on file    Attends religious service: Not on file    Active member of club or organization: Not on file    Attends meetings of clubs or organizations: Not on file    Relationship status: Not on file  . Intimate partner violence:    Fear of current or ex partner: Not on file    Emotionally abused: Not on file    Physically abused: Not on file    Forced sexual activity: Not on file  Other Topics Concern  . Not on file  Social History  Narrative  . Not on file    Family History  Problem Relation Age of Onset  . Colon cancer Neg Hx     Past Surgical History:  Procedure Laterality Date  . COLONOSCOPY N/A 04/26/2016   Procedure: COLONOSCOPY;  Surgeon: West Bali, MD;  Location: [facility];  Service: Endoscopy;  Laterality: N/A;  1200  . CYSTOSCOPY WITH INSERTION OF UROLIFT N/A 01/02/2018   Procedure: CYSTOSCOPY WITH INSERTION OF UROLIFT;  Surgeon: Malen Gauze, MD;  Location: [facility];  Service: Urology;  Laterality: N/A;    ROS: Review of Systems Negative except as stated above PHYSICAL EXAM: BP 132/81   Pulse 67   Temp 97.8 F (36.6 C) (Oral)   Resp 16   Wt 163 lb 12.8 oz (74.3 kg)   SpO2 100%   BMI 24.19 kg/m   Physical  Exam General appearance - alert, well appearing, and in no distress Mental status - normal mood, behavior, speech, dress, motor activity, and thought processes Eyes - pupils equal and reactive, extraocular eye movements intact Mouth -oral mucosa is moist without oral lesions.  He has partials above.  No enlargement of the parotid or submandibular glands. Neck - supple, no significant adenopathy Chest - clear to auscultation, no wheezes, rales or rhonchi, symmetric air entry Heart - normal rate, regular rhythm, normal S1, S2, no murmurs, rubs, clicks or gallops Abdomen - soft, nontender, nondistended, no masses or organomegaly Extremities - peripheral pulses normal, no pedal edema, no clubbing or cyanosis  Results for orders placed or performed in visit on 04/03/18  POCT URINALYSIS DIP (CLINITEK)  Result Value Ref Range   Color, UA yellow yellow   Clarity, UA clear clear   Glucose, UA negative negative mg/dL   Bilirubin, UA negative negative   Ketones, POC UA negative negative mg/dL   Spec Grav, UA 9.604 5.409 - 1.025   Blood, UA trace-intact (A) negative   pH, UA 6.5 5.0 - 8.0   POC Protein UA Negative Negative, Trace   Urobilinogen, UA 0.2 0.2 or 1.0 E.U./dL   Nitrite, UA Negative Negative   Leukocytes, UA Negative Negative    ASSESSMENT AND PLAN: 1. Dry mouth Of questionable etiology.  He is currently not on any medications.  Will screen for Sjogren's.  Advised purchasing biotine mouthwash over-the-counter and using as needed - Sjogren's syndrome antibods(ssa + ssb)  2. Nausea in adult Of questionable etiology.  We will give her a limited supply of Phenergan to use as needed.  Patient informed that the medication can cause drowsiness. - promethazine (PHENERGAN) 12.5 MG tablet; Take 1 tablet (12.5 mg total) by mouth every 8 (eight) hours as needed for nausea or vomiting.  Dispense: 15 tablet; Refill: 0  3. Foul smelling urine UA negative for any infection.  Encourage patient to  drink several glasses of water daily - POCT URINALYSIS DIP (CLINITEK)  Patient was given the opportunity to ask questions.  Patient verbalized understanding of the plan and was able to repeat key elements of the plan.  [employer] interpreter used during this encounter.  Orders Placed This Encounter  Procedures  . Sjogren's syndrome antibods(ssa + ssb)  . POCT URINALYSIS DIP (CLINITEK)     Requested Prescriptions   Signed Prescriptions Disp Refills  . promethazine (PHENERGAN) 12.5 MG tablet 15 tablet 0    Sig: Take 1 tablet (12.5 mg total) by mouth every 8 (eight) hours as needed for nausea or vomiting.    Return in about 5 weeks (  around 05/08/2018).  Jonah Blue, MD, FACP

## 2018-04-03 NOTE — Patient Instructions (Signed)
Purchase and use Biotin mouthwash over-the-counter and use as needed for dry mouth.

## 2018-04-03 NOTE — Assessment & Plan Note (Signed)
Cystoscopy with Urolift Device 12/2017 - Dr. Ronne Binning

## 2018-04-04 LAB — SJOGREN'S SYNDROME ANTIBODS(SSA + SSB): ENA SSA (RO) Ab: <0.2 AI (ref 0.0–0.9)

## 2018-04-11 ENCOUNTER — Telehealth: Payer: Self-pay

## 2018-04-11 NOTE — Telephone Encounter (Signed)
[employer]  Id# [id number]  contacted pt to go over lab results pt didn't answer left a detailed vm informing pt of results and if he has any questions or concerns to give me a call  If pt calls back please give results: lab test to screen for disease that can cause dry mouth is normal. He can purchase and try an over the counter mouth wash called Biotene to help decrease dry mouth symptom.

## 2018-05-11 ENCOUNTER — Ambulatory Visit: Payer: Self-pay | Admitting: Internal Medicine

## 2018-05-29 ENCOUNTER — Ambulatory Visit: Payer: Self-pay | Attending: Internal Medicine

## 2018-05-30 ENCOUNTER — Ambulatory Visit: Payer: Self-pay | Attending: Internal Medicine | Admitting: Internal Medicine

## 2018-05-30 ENCOUNTER — Encounter: Payer: Self-pay | Admitting: Internal Medicine

## 2018-05-30 VITALS — BP 122/68 | HR 80 | Temp 98.1°F | Resp 16 | Wt 163.8 lb

## 2018-05-30 DIAGNOSIS — E559 Vitamin D deficiency, unspecified: Secondary | ICD-10-CM | POA: Insufficient documentation

## 2018-05-30 DIAGNOSIS — Z881 Allergy status to other antibiotic agents status: Secondary | ICD-10-CM | POA: Insufficient documentation

## 2018-05-30 DIAGNOSIS — M546 Pain in thoracic spine: Secondary | ICD-10-CM | POA: Insufficient documentation

## 2018-05-30 DIAGNOSIS — Z79899 Other long term (current) drug therapy: Secondary | ICD-10-CM | POA: Insufficient documentation

## 2018-05-30 DIAGNOSIS — Z87891 Personal history of nicotine dependence: Secondary | ICD-10-CM | POA: Insufficient documentation

## 2018-05-30 DIAGNOSIS — I10 Essential (primary) hypertension: Secondary | ICD-10-CM | POA: Insufficient documentation

## 2018-05-30 DIAGNOSIS — I861 Scrotal varices: Secondary | ICD-10-CM | POA: Insufficient documentation

## 2018-05-30 DIAGNOSIS — N50812 Left testicular pain: Secondary | ICD-10-CM

## 2018-05-30 DIAGNOSIS — G44039 Episodic paroxysmal hemicrania, not intractable: Secondary | ICD-10-CM | POA: Insufficient documentation

## 2018-05-30 DIAGNOSIS — Z9889 Other specified postprocedural states: Secondary | ICD-10-CM | POA: Insufficient documentation

## 2018-05-30 MED ORDER — IBUPROFEN 600 MG PO TABS: 600.0000 mg | ORAL_TABLET | Freq: Three times a day (TID) | ORAL | 1 refills | Status: DC | PRN

## 2018-05-30 MED FILL — IBUPROFEN 600 MG TABLET: 600 | 10 days supply | Qty: 30 | Fill #0

## 2018-05-30 NOTE — Progress Notes (Signed)
Patient ID: Troy Harrison, male    DOB: [birth year]  MRN: [id number]  CC: Headache and testical pain   Subjective: Troy Harrison is a 57 y.o. male who presents for UC visit His concerns today include:  57 year old male with history of HTN(control ofmed), chronic midline thoracic back pain(OA and scoliosis), vitamin D deficiency,BPHand varicocele.  C/o HA that started yesterday.  Started after he got wet in rain yesterday. It is on the RT side of the head.  No N/V, blurred vision or photophobia.  No jaw claudication.  Took two 200 mg Ibuprofen For past two mths, he has been having episodes of sharp HA that alternates sides that last a few seconds.  Occurs several times a wk  Had pain in LT testicle for a few days several days ago.  He thinks the LT testicle hangs a little lower than the RT.  Had testiclar US 10/2016.  Patient Active Problem List   Diagnosis Date Noted  . Weight loss, unintentional 08/25/2017  . 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 Chronic midline thoracic back pain 03/08/2017  . Benign prostatic hyperplasia with urinary frequency 12/06/2016  . Varicocele present on ultrasound of scrotum 12/06/2016  . Vitamin D deficiency 12/06/2016  . HTN (hypertension), benign 11/08/2016  . Constipation 12/17/2013  . Hemorrhoids, external without complications 12/17/2013  . Special screening for malignant neoplasms, colon 12/17/2013     Current Outpatient Medications on File Prior to Visit  Medication Sig Dispense Refill  . promethazine (PHENERGAN) 12.5 MG tablet Take 1 tablet (12.5 mg total) by mouth every 8 (eight) hours as needed for nausea or vomiting. (Patient not taking: Reported on 05/30/2018) 15 tablet 0   No current facility-administered medications on file prior to visit.     Allergies  Allergen Reactions  . Bactrim [Sulfamethoxazole-Trimethoprim] Itching    Social History   Socioeconomic History  . Marital status: Married    Spouse  name: Not on file  . Number of children: Not on file  . Years of education: Not on file  . Highest education level: Not on file  Occupational History  . Not on file  Social Needs  . Financial resource strain: Not on file  . Food insecurity:    Worry: Not on file    Inability: Not on file  . Transportation needs:    Medical: Not on file    Non-medical: Not on file  Tobacco Use  . Smoking status: Former Smoker    Last attempt to quit: 04/26/1989    Years since quitting: 29.1  . Smokeless tobacco: Never Used  Substance and Sexual Activity  . Alcohol use: No  . Drug use: No  . Sexual activity: Yes  Lifestyle  . Physical activity:    Days per week: Not on file    Minutes per session: Not on file  . Stress: Not on file  Relationships  . Social connections:    Talks on phone: Not on file    Gets together: Not on file    Attends religious service: Not on file    Active member of club or organization: Not on file    Attends meetings of clubs or organizations: Not on file    Relationship status: Not on file  . Intimate partner violence:    Fear of current or ex partner: Not on file    Emotionally abused: Not on file    Physically abused: Not on file    Forced sexual activity: Not on  file  Other Topics Concern  . Not on file  Social History Narrative  . Not on file    Family History  Problem Relation Age of Onset  . Colon cancer Neg Hx     Past Surgical History:  Procedure Laterality Date  . COLONOSCOPY N/A 04/26/2016   Procedure: COLONOSCOPY;  Surgeon: West Bali, MD;  [facility]: [facility];  Service: Endoscopy;  Laterality: N/A;  1200  . CYSTOSCOPY WITH INSERTION OF UROLIFT N/A 01/02/2018   Procedure: CYSTOSCOPY WITH INSERTION OF UROLIFT;  Surgeon: Malen Gauze, MD;  Location: [facility];  Service: Urology;  Laterality: N/A;    ROS: Review of Systems Negative except as above PHYSICAL EXAM: BP 122/68   Pulse 80   Temp 98.1 F (36.7 C) (Oral)   Resp 16    Wt 163 lb 12.8 oz (74.3 kg)   SpO2 99%   BMI 24.19 kg/m   Wt Readings from Last 3 Encounters:  05/30/18 163 lb 12.8 oz (74.3 kg)  04/03/18 163 lb 12.8 oz (74.3 kg)  03/08/18 163 lb (73.9 kg)    Physical Exam  General appearance - alert, well appearing, and in no distress Mental status - normal mood, behavior, speech, dress, motor activity, and thought processes GU Male - no penile lesions or discharge, no testicular masses or tenderness, no hernias.  LT testicle hangs a little lower than RT Neurological - cranial nerves II through XII intact, motor and sensory grossly normal bilaterally, Romberg sign negative, normal gait and station No tenderness over the temporal arteries  ASSESSMENT AND PLAN: 1. Episodic paroxysmal hemicrania, not intractable - ibuprofen (ADVIL,MOTRIN) 600 MG tablet; Take 1 tablet (600 mg total) by mouth every 8 (eight) hours as needed.  Dispense: 30 tablet; Refill: 1  2. Testicular pain, left Patient had testicular ultrasound done last year that revealed a small varicocele on the left side.  Pt told that it is not unusual for one testicle to hang a little lower than the other - Ambulatory referral to Urology    Patient was given the opportunity to ask questions.  Patient verbalized understanding of the plan and was able to repeat key elements of the plan.  [employer] interpreter used during this encounter.#[id number] Orders Placed This Encounter  Procedures  . Ambulatory referral to Urology     Requested Prescriptions   Signed Prescriptions Disp Refills  . ibuprofen (ADVIL,MOTRIN) 600 MG tablet 30 tablet 1    Sig: Take 1 tablet (600 mg total) by mouth every 8 (eight) hours as needed.    Return if symptoms worsen or fail to improve.  Jonah Blue, MD, FACP

## 2018-06-19 ENCOUNTER — Ambulatory Visit: Payer: Self-pay | Attending: Family Medicine

## 2018-07-14 ENCOUNTER — Ambulatory Visit: Payer: Self-pay | Admitting: Internal Medicine

## 2018-09-06 ENCOUNTER — Ambulatory Visit (INDEPENDENT_AMBULATORY_CARE_PROVIDER_SITE_OTHER): Payer: Self-pay | Admitting: Urology

## 2018-09-06 DIAGNOSIS — N138 Other obstructive and reflux uropathy: Secondary | ICD-10-CM

## 2018-09-06 DIAGNOSIS — N401 Enlarged prostate with lower urinary tract symptoms: Secondary | ICD-10-CM

## 2018-09-06 DIAGNOSIS — R351 Nocturia: Secondary | ICD-10-CM

## 2018-09-06 DIAGNOSIS — R3914 Feeling of incomplete bladder emptying: Secondary | ICD-10-CM

## 2018-09-07 MED FILL — ALFUZOSIN HCL ER 10 MG TAB: 10 | 30 days supply | Qty: 30 | Fill #0

## 2018-11-02 ENCOUNTER — Ambulatory Visit (INDEPENDENT_AMBULATORY_CARE_PROVIDER_SITE_OTHER): Payer: Self-pay

## 2018-11-02 ENCOUNTER — Ambulatory Visit (HOSPITAL_COMMUNITY)
Admission: EM | Admit: 2018-11-02 | Discharge: 2018-11-02 | Disposition: A | Discharge status: Home / Self Care | Payer: Self-pay | Attending: Internal Medicine | Admitting: Internal Medicine

## 2018-11-02 ENCOUNTER — Encounter (HOSPITAL_COMMUNITY): Payer: Self-pay | Admitting: Emergency Medicine

## 2018-11-02 ENCOUNTER — Other Ambulatory Visit: Payer: Self-pay

## 2018-11-02 DIAGNOSIS — R0789 Other chest pain: Secondary | ICD-10-CM

## 2018-11-02 MED ORDER — KETOROLAC TROMETHAMINE 30 MG/ML IJ SOLN: 30.0000 mg | Freq: Once | INTRAMUSCULAR | Status: DC

## 2018-11-02 MED ORDER — CYCLOBENZAPRINE HCL 10 MG PO TABS: 10.0000 mg | ORAL_TABLET | Freq: Two times a day (BID) | ORAL | 0 refills | Status: DC | PRN

## 2018-11-02 MED ORDER — KETOROLAC TROMETHAMINE 30 MG/ML IJ SOLN
INTRAMUSCULAR | Status: AC
  Filled 2018-11-02: qty 1

## 2018-11-02 MED FILL — CYCLOBENZAPRINE 10 MG TAB: 10 | 10 days supply | Qty: 20 | Fill #0

## 2018-11-02 NOTE — ED Provider Notes (Signed)
[facility]    CSN: [id number] Arrival date & time: 11/02/18  4098     History   Chief Complaint Chief Complaint  Patient presents with  . Fall    HPI Troy Harrison is a 58 y.o. male with a history of benign prostatic hypertrophy not on any medications comes to urgent care with complaints of left-sided chest pain which started yesterday after he sustained a fall.  He landed on the ground on an outstretched arm and subsequently complained of left wrist pain left-sided chest pain.  Pain is achy and persistent.  Aggravated by movement.  No known relieving factors.  Is associated with some shortness of breath.  No fever or chills.  No sputum production.  Pain is nonradiating.  HPI  Past Medical History:  Diagnosis Date  . Enlarged prostate     Patient Active Problem List   Diagnosis Date Noted  . Weight loss, unintentional 08/25/2017  . 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 Chronic midline thoracic back pain 03/08/2017  . Benign prostatic hyperplasia with urinary frequency 12/06/2016  . Varicocele present on ultrasound of scrotum 12/06/2016  . Vitamin D deficiency 12/06/2016  . HTN (hypertension), benign 11/08/2016  . Constipation 12/17/2013  . Hemorrhoids, external without complications 12/17/2013  . Special screening for malignant neoplasms, colon 12/17/2013    Past Surgical History:  Procedure Laterality Date  . COLONOSCOPY N/A 04/26/2016   Procedure: COLONOSCOPY;  Surgeon: West Bali, MD;  Location: [facility];  Service: Endoscopy;  Laterality: N/A;  1200  . CYSTOSCOPY WITH INSERTION OF UROLIFT N/A 01/02/2018   Procedure: CYSTOSCOPY WITH INSERTION OF UROLIFT;  Surgeon: Malen Gauze, MD;  Location: [facility];  Service: Urology;  Laterality: N/A;       Home Medications    Prior to Admission medications   Medication Sig Start Date End Date Taking? Authorizing Provider  ibuprofen (ADVIL,MOTRIN) 600 MG tablet Take 1 tablet (600 mg total)  by mouth every 8 (eight) hours as needed. 05/30/18   Marcine Matar, MD  promethazine (PHENERGAN) 12.5 MG tablet Take 1 tablet (12.5 mg total) by mouth every 8 (eight) hours as needed for nausea or vomiting. Patient not taking: Reported on 05/30/2018 04/03/18   Marcine Matar, MD    Family History Family History  Problem Relation Age of Onset  . Colon cancer Neg Hx     Social History Social History   Tobacco Use  . Smoking status: Former Smoker    Last attempt to quit: 04/26/1989    Years since quitting: 29.5  . Smokeless tobacco: Never Used  Substance Use Topics  . Alcohol use: No  . Drug use: No     Allergies   Bactrim [sulfamethoxazole-trimethoprim]   Review of Systems Review of Systems  Constitutional: Negative for activity change, appetite change, fatigue and fever.  HENT: Negative.   Respiratory: Negative for chest tightness, shortness of breath and wheezing.   Cardiovascular: Negative.   Gastrointestinal: Negative for abdominal distention, abdominal pain, diarrhea and nausea.  Genitourinary: Negative.   Musculoskeletal: Positive for arthralgias and back pain. Negative for gait problem, joint swelling and myalgias.  Skin: Negative.   Neurological: Negative for dizziness, seizures, light-headedness and numbness.  Hematological: Negative.      Physical Exam Triage Vital Signs ED Triage Vitals [11/02/18 1048]  Enc Vitals Group     BP 114/64     Pulse Rate 80     Resp 16     Temp 98.7 F (  37.1 C)     Temp Source Oral     SpO2 100 %     Weight      Height      Head Circumference      Peak Flow      Pain Score      Pain Loc      Pain Edu?      Excl. in GC?    No data found.  Updated Vital Signs BP 114/64 (BP Location: Right Arm)   Pulse 80   Temp 98.7 F (37.1 C) (Oral)   Resp 16   SpO2 100%   Visual Acuity Right Eye Distance:   Left Eye Distance:   Bilateral Distance:    Right Eye Near:   Left Eye Near:    Bilateral Near:      Physical Exam Constitutional:      Appearance: Normal appearance.  HENT:     Mouth/Throat:     Mouth: Mucous membranes are moist.     Pharynx: No oropharyngeal exudate.  Neck:     Musculoskeletal: Normal range of motion. Muscular tenderness present. No neck rigidity.  Cardiovascular:     Rate and Rhythm: Normal rate and regular rhythm.     Pulses: Normal pulses.     Heart sounds: Normal heart sounds.  Pulmonary:     Effort: Pulmonary effort is normal. No respiratory distress.     Breath sounds: Normal breath sounds. No wheezing, rhonchi or rales.  Chest:     Chest wall: Tenderness present.  Abdominal:     General: Bowel sounds are normal. There is no distension.     Palpations: Abdomen is soft.     Tenderness: There is no abdominal tenderness. There is no guarding.  Musculoskeletal: Normal range of motion.        General: Tenderness present.     Comments: Tenderness over the left side of the chest wall.  Full range of motion around the shoulder, elbow and wrist joints.  No swelling or deformities.  No bruising.  Lymphadenopathy:     Cervical: No cervical adenopathy.  Skin:    General: Skin is warm.     Capillary Refill: Capillary refill takes less than 2 seconds.  Neurological:     General: No focal deficit present.     Mental Status: He is alert and oriented to person, place, and time.      UC Treatments / Results  Labs (all labs ordered are listed, but only abnormal results are displayed) Labs Reviewed - No data to display  EKG None  Radiology No results found.  Procedures Procedures (including critical care time)  Medications Ordered in UC Medications - No data to display  Initial Impression / Assessment and Plan / UC Course  I have reviewed the triage vital signs and the nursing notes.  Pertinent labs & imaging results that were available during my care of the patient were reviewed by me and considered in my medical decision making (see chart for  details).     1.  Fall with left-sided chest wall pain: Chest x-ray was independently reviewed by me and was negative for any acute lung infiltrate or rib fractures. Patient was offered Toradol but he refused. Patient is advised to take ibuprofen at home He was prescribed Flexeril for muscle stiffness. Final Clinical Impressions(s) / UC Diagnoses   Final diagnoses:  None   Discharge Instructions   None    ED Prescriptions    None  Controlled Substance Prescriptions [place] Controlled Substance Registry consulted? No   Merrilee JanskyLamptey, Philip O, MD 11/02/18 1148

## 2018-11-02 NOTE — ED Triage Notes (Signed)
Pt states yesterday morning he stepped on something that slipped out from under him and he landed on his L side, states he reached his arm out to catch his fall. C/o L sided arm, chest, back pain.

## 2018-11-02 NOTE — ED Notes (Signed)
Patient refused toradol per patient

## 2018-11-21 ENCOUNTER — Inpatient Hospital Stay: Payer: Self-pay | Admitting: Internal Medicine

## 2018-12-25 ENCOUNTER — Ambulatory Visit: Payer: Self-pay | Admitting: Internal Medicine

## 2018-12-27 ENCOUNTER — Telehealth: Payer: Self-pay | Admitting: Internal Medicine

## 2018-12-27 NOTE — Telephone Encounter (Signed)
Patient called stating he got a missed call and believes it was in regards to his financial appt. Please follow up

## 2018-12-28 ENCOUNTER — Ambulatory Visit: Payer: Self-pay | Admitting: General Surgery

## 2018-12-28 NOTE — Telephone Encounter (Signed)
The was from the automatic system confirming the appt for Friday only

## 2018-12-29 ENCOUNTER — Ambulatory Visit: Payer: Self-pay | Attending: Internal Medicine

## 2018-12-29 ENCOUNTER — Ambulatory Visit: Payer: Self-pay

## 2018-12-29 ENCOUNTER — Other Ambulatory Visit: Payer: Self-pay

## 2019-01-09 ENCOUNTER — Ambulatory Visit: Payer: Self-pay | Admitting: General Surgery

## 2019-01-18 ENCOUNTER — Ambulatory Visit: Payer: Self-pay | Attending: Internal Medicine | Admitting: Internal Medicine

## 2019-01-18 ENCOUNTER — Encounter: Payer: Self-pay | Admitting: Internal Medicine

## 2019-01-18 ENCOUNTER — Other Ambulatory Visit: Payer: Self-pay

## 2019-01-18 VITALS — BP 129/76 | HR 83 | Temp 98.2°F | Resp 16 | Wt 164.6 lb

## 2019-01-18 DIAGNOSIS — R1030 Lower abdominal pain, unspecified: Secondary | ICD-10-CM

## 2019-01-18 DIAGNOSIS — R300 Dysuria: Secondary | ICD-10-CM

## 2019-01-18 LAB — POCT URINALYSIS DIP (CLINITEK)
Glucose, UA: NEGATIVE mg/dL
Ketones, POC UA: NEGATIVE mg/dL
Leukocytes, UA: NEGATIVE
Nitrite, UA: NEGATIVE
Spec Grav, UA: 1.02 (ref 1.010–1.025)
Urobilinogen, UA: 0.2 E.U./dL
pH, UA: 6 (ref 5.0–8.0)

## 2019-01-18 NOTE — Progress Notes (Signed)
Patient ID: Troy Harrison, male    DOB: January 26, 1961  MRN: [id number]  CC: Dysuria   Subjective: Troy Harrison is a 58 y.o. male who presents for chronic ds management His concerns today include:  history of HTN(control ofmed), chronic midline thoracic back pain(OA and scoliosis), vitamin D deficiency,BPHand varicocele.  Pt wanting to have imaging study done "to figure what is going on with my intestine and to see how many hernias I have." -feels something bad is going on because he "hears some sounds in his abdomen."  He describes it as the same sound the stomach makes "when you are hungry but I hear it even when I am full."  It is like a growling sound.  -very little nausea at times but no vomiting -no abdominal pain -constipation sometimes. No blood in stools.  His weight has remained stable. c-scope in 2017  Saw his urologist Dr. Ronne BinningMcKenzie 2 to 3 months ago.  He complained of some suprapubic and bilateral lower quadrant abdominal pain.  Reports having had a study done in the urologist office that revealed that he has a hernia.  It sounds like he had an ultrasound done in the office.  He does not recall which side the hernia was on.  He has been referred to [facility] Franky Mach[facility]Mark Jenkins [facility] for further evaluation of the hernia - Request urine check today.  Urine with odor at times.  Sometimes yellow and sometimes a brown color. Not sure if blood.  No pain with urination Takes a Tylenol about once a wk  Patient Active Problem List   Diagnosis Date Noted  . Weight loss, unintentional 08/25/2017  . 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 Chronic midline thoracic back pain 03/08/2017  . Benign prostatic hyperplasia with urinary frequency 12/06/2016  . Varicocele present on ultrasound of scrotum 12/06/2016  . Vitamin D deficiency 12/06/2016  . HTN (hypertension), benign 11/08/2016  . Constipation 12/17/2013  . Hemorrhoids, external without  complications 12/17/2013  . Special screening for malignant neoplasms, colon 12/17/2013     Current Outpatient Medications on File Prior to Visit  Medication Sig Dispense Refill  . cyclobenzaprine (FLEXERIL) 10 MG tablet Take 1 tablet (10 mg total) by mouth 2 (two) times daily as needed for muscle spasms. 20 tablet 0  . ibuprofen (ADVIL,MOTRIN) 600 MG tablet Take 1 tablet (600 mg total) by mouth every 8 (eight) hours as needed. 30 tablet 1   No current facility-administered medications on file prior to visit.     Allergies  Allergen Reactions  . Bactrim [Sulfamethoxazole-Trimethoprim] Itching    Social History   Socioeconomic History  . Marital status: Married    Spouse name: Not on file  . Number of children: Not on file  . Years of education: Not on file  . Highest education level: Not on file  Occupational History  . Not on file  Social Needs  . Financial resource strain: Not on file  . Food insecurity    Worry: Not on file    Inability: Not on file  . Transportation needs    Medical: Not on file    Non-medical: Not on file  Tobacco Use  . Smoking status: Former Smoker    Quit date: 04/26/1989    Years since quitting: 29.7  . Smokeless tobacco: Never Used  Substance and Sexual Activity  . Alcohol use: No  . Drug use: No  . Sexual activity: Yes  Lifestyle  . Physical activity  Days per week: Not on file    Minutes per session: Not on file  . Stress: Not on file  Relationships  . Social Herbalist on phone: Not on file    Gets together: Not on file    Attends religious service: Not on file    Active member of club or organization: Not on file    Attends meetings of clubs or organizations: Not on file    Relationship status: Not on file  . Intimate partner violence    Fear of current or ex partner: Not on file    Emotionally abused: Not on file    Physically abused: Not on file    Forced sexual activity: Not on file  Other Topics Concern  .  Not on file  Social History Narrative  . Not on file    Family History  Problem Relation Age of Onset  . Colon cancer Neg Hx     Past Surgical History:  Procedure Laterality Date  . COLONOSCOPY N/A 04/26/2016   Procedure: COLONOSCOPY;  Surgeon: Danie Binder, MD;  [facility];  Service: Endoscopy;  Laterality: N/A;  1200  . CYSTOSCOPY WITH INSERTION OF UROLIFT N/A 01/02/2018   Procedure: CYSTOSCOPY WITH INSERTION OF UROLIFT;  Surgeon: Cleon Gustin, MD;  Location: [facility];  Service: Urology;  Laterality: N/A;    ROS: Review of Systems Negative except as stated above  PHYSICAL EXAM: BP 129/76   Pulse 83   Temp 98.2 F (36.8 C) (Oral)   Resp 16   Wt 164 lb 9.6 oz (74.7 kg)   SpO2 99%   BMI 24.31 kg/m   Wt Readings from Last 3 Encounters:  01/18/19 164 lb 9.6 oz (74.7 kg)  05/30/18 163 lb 12.8 oz (74.3 kg)  04/03/18 163 lb 12.8 oz (74.3 kg)    Physical Exam  General appearance - alert, well appearing, and in no distress Mental status - normal mood, behavior, speech, dress, motor activity, and thought processes Abdomen - soft, nontender, nondistended, no masses or organomegaly.  I do not appreciate any hernias in the midline or in the lower quadrants on exam GU Male -CMA Poliak present: No inguinal hernias appreciated.  CMP Latest Ref Rng & Units 03/08/2018 12/28/2017 08/25/2017  Glucose 65 - 99 mg/dL 90 81 75  BUN 6 - 24 mg/dL 15 18 14   Creatinine 0.76 - 1.27 mg/dL 0.85 0.69 0.80  Sodium 134 - 144 mmol/L 141 135 140  Potassium 3.5 - 5.2 mmol/L 4.6 3.8 4.5  Chloride 96 - 106 mmol/L 99 102 101  CO2 20 - 29 mmol/L 26 26 27   Calcium 8.7 - 10.2 mg/dL 9.3 8.3(L) 9.0  Total Protein 6.0 - 8.5 g/dL 6.6 - 7.0  Total Bilirubin 0.0 - 1.2 mg/dL 0.4 - 0.4  Alkaline Phos 39 - 117 IU/L 66 - 65  AST 0 - 40 IU/L 24 - 26  ALT 0 - 44 IU/L 25 - 21   Lipid Panel     Component Value Date/Time   CHOL 189 11/08/2016 1117   TRIG 72 11/08/2016 1117   HDL 93 11/08/2016  1117   CHOLHDL 2.0 11/08/2016 1117   CHOLHDL 2.1 03/14/2014 1220   VLDL 17 03/14/2014 1220   LDLCALC 82 11/08/2016 1117    CBC    Component Value Date/Time   WBC 5.0 12/28/2017 1230   RBC 4.87 12/28/2017 1230   HGB 14.9 12/28/2017 1230   HGB 16.1 08/25/2017 1231  HCT 44.4 12/28/2017 1230   HCT 47.6 08/25/2017 1231   PLT 238 12/28/2017 1230   PLT 230 08/25/2017 1231   MCV 91.2 12/28/2017 1230   MCV 91 08/25/2017 1231   MCH 30.6 12/28/2017 1230   MCHC 33.6 12/28/2017 1230   RDW 13.0 12/28/2017 1230   RDW 13.1 08/25/2017 1231   LYMPHSABS 1.8 11/08/2016 1117   MONOABS 0.4 12/11/2013 1546   EOSABS 0.1 11/08/2016 1117   BASOSABS 0.0 11/08/2016 1117   Results for orders placed or performed in visit on 01/18/19  POCT URINALYSIS DIP (CLINITEK)  Result Value Ref Range   Color, UA yellow yellow   Clarity, UA cloudy (A) clear   Glucose, UA negative negative mg/dL   Bilirubin, UA small (A) negative   Ketones, POC UA negative negative mg/dL   Spec Grav, UA [id number] - 1.025   Blood, UA trace-intact (A) negative   pH, UA 6.0 5.0 - 8.0   POC PROTEIN,UA trace negative, trace   Urobilinogen, UA 0.2 0.2 or 1.0 E.U./dL   Nitrite, UA Negative Negative   Leukocytes, UA Negative Negative    ASSESSMENT AND PLAN: 1. Lower abdominal pain Reassurance given in regards to the growling sound that he hears in the abdomen.  I reassured him that this is not detrimental to his health. -I have asked my CMA to call [facility] and get Dr. Dimas MillinMcKenzie's last note for me to see what imaging study was done and the findings to determine whether CT needs to be done prior to him seeing the surgeon next month  2. Dysuria - POCT URINALYSIS DIP (CLINITEK) - Urinalysis     Patient was given the opportunity to ask questions.  Patient verbalized understanding of the plan and was able to repeat key elements of the plan.  [employer] interpreter used during this encounter. #[id number]   Orders Placed This  Encounter  Procedures  . Urinalysis  . POCT URINALYSIS DIP (CLINITEK)     Requested Prescriptions    No prescriptions requested or ordered in this encounter    Return in about 3 months (around 04/20/2019).  Jonah Blueeborah Rasean Joos, MD, FACP

## 2019-01-18 NOTE — Progress Notes (Signed)
Pt states his pain is coming from his testicles  Pt is requesting to have a image done on his abdomen to check to see how many hernias he has

## 2019-01-19 LAB — URINALYSIS
Bilirubin, UA: NEGATIVE
Glucose, UA: NEGATIVE
Leukocytes,UA: NEGATIVE
Nitrite, UA: NEGATIVE
RBC, UA: NEGATIVE
Specific Gravity, UA: >=1.03 — AB (ref 1.005–1.030)
Urobilinogen, Ur: 0.2 mg/dL (ref 0.2–1.0)
pH, UA: 6 (ref 5.0–7.5)

## 2019-01-24 ENCOUNTER — Encounter: Payer: Self-pay | Admitting: Internal Medicine

## 2019-01-24 NOTE — Progress Notes (Unsigned)
Received not from [facility]. Pt had a PVR ultrasound done on visit 09/06/2018 with Dr. Nicolette Bang.  Pt was referred to general surgeon for BL inguinal hernias.

## 2019-01-25 NOTE — Progress Notes (Signed)
[employer] Freida Busman  Id# [id number]  contacted pt to go over lab results and to go over Dr. Wynetta Emery message pt doesn't have any questions or concerns

## 2019-02-01 ENCOUNTER — Encounter: Payer: Self-pay | Admitting: General Surgery

## 2019-02-01 ENCOUNTER — Ambulatory Visit (INDEPENDENT_AMBULATORY_CARE_PROVIDER_SITE_OTHER): Payer: Self-pay | Admitting: General Surgery

## 2019-02-01 ENCOUNTER — Other Ambulatory Visit: Payer: Self-pay

## 2019-02-01 VITALS — BP 121/71 | HR 72 | Temp 98.7°F | Resp 16 | Ht 69.0 in | Wt 163.0 lb

## 2019-02-01 DIAGNOSIS — K4020 Bilateral inguinal hernia, without obstruction or gangrene, not specified as recurrent: Secondary | ICD-10-CM

## 2019-02-01 NOTE — Patient Instructions (Signed)
Hernia inguinal en los adultos Inguinal Hernia, Adult Una hernia inguinal se produce cuando la grasa o los intestinos empujan a travs de una zona dbil de un msculo donde se unen la pierna y el abdomen inferior (ingle). Esto crea un bulto. Este tipo de hernia tambin puede aparecer en:  En el escroto, si es varn.  En los pliegues de la piel alrededor de la vagina, si es mujer. Existen tres tipos de hernias inguinales:  Hernias que se pueden empujar hacia adentro del abdomen (son reducibles). Este tipo de hernias rara vez provoca dolor.  Hernias que no son reducibles (estn encarceladas).  Hernias que no son reducibles y pierden la irrigacin sangunea (estn estranguladas). Este tipo de hernia requiere ciruga de emergencia. Cules son las causas? Esta afeccin se produce cuando tiene una zona dbil en los msculos o en los tejidos de la ingle. Esta zona dbil se desarrolla con el transcurso del tiempo. La hernia puede salirse por la zona dbil cuando ejerce un esfuerzo repentino sobre los msculos abdominales inferiores, por ejemplo, en los siguientes casos:  Levanta un objeto pesado.  Hace esfuerzos para defecar. El estreimiento puede llevar a tener que hacer un gran esfuerzo.  Tos. Qu incrementa el riesgo? Es ms probable que esta afeccin se manifieste en:  Los hombres.  Las mujeres embarazadas.  Personas que: ? Tiene sobrepeso. ? Realizan trabajos que requieren permanecer estar de pie durante largos perodos o levantar cargas pesadas. ? Han tenido una hernia inguinal previamente. ? Fuman o tienen una enfermedad pulmonar. Estos factores pueden causar una tos duradera (crnica). Cules son los signos o los sntomas? Los sntomas pueden depender del tamao de la hernia. Con frecuencia, una pequea hernia inguinal no tiene sntomas. Estos son algunos de los sntomas de una hernia ms grande:  Un bulto en la zona de la ingle. Este es fcil de ver cuando se encuentra de  pie. Podra no ser visible si se encuentra acostado.  Dolor o ardor en la ingle. Esto podra empeorar cuando levanta un objeto, realiza un esfuerzo o tose.  Un dolor sordo o una sensacin de presin en la ingle.  Un bulto inusual en el escroto del hombre. Los sntomas de una hernia inguinal estrangulada pueden incluir lo siguiente:  Un bulto en la ingle que es muy doloroso y sensible al tacto.  Un bulto que se torna de color rojo o prpura.  Fiebre, nuseas y vmitos.  Imposibilidad de defecar o eliminar gases. Cmo se diagnostica? Esta afeccin se diagnostica en funcin de los sntomas, los antecedentes mdicos y un examen fsico. El mdico puede examinar la zona de la ingle y pedirle que tosa. Cmo se trata? El tratamiento depende del tamao de la hernia y de si tiene sntomas o no. Si no tiene sntomas, el mdico podr indicarle que controle cuidadosamente la hernia y que concurra a las visitas de control. Si tiene sntomas o una hernia grande, es posible que necesite ciruga para repararla. Siga estas indicaciones en su casa: Estilo de vida  Evite levantar objetos pesados.  Intente no estar de pie durante mucho tiempo.  No consuma ningn producto que contenga nicotina o tabaco, como cigarrillos y cigarrillos electrnicos. Si necesita ayuda para dejar de fumar, consulte al mdico.  Mantenga un peso saludable. Prevencin del estreimiento  Tome medidas para evitar el estreimiento. El estreimiento obliga a realizar esfuerzos para defecar, los cuales pueden agravar una hernia o causar la rotura de la reparacin. El mdico puede recomendarle que: ? Beba suficiente   lquido para mantener la orina de color amarillo plido. ? Consuma alimentos ricos en fibra, como frutas y verduras frescas, cereales integrales y frijoles. ? Limite el consumo de alimentos ricos en grasa y azcares procesados, como los alimentos fritos o dulces. ? Tome medicamentos recetados o de venta libre para el  estreimiento. Instrucciones generales  Puede intentar colocar la hernia en su lugar ejerciendo sobre esta una presin muy suave mientras est acostado. No intente forzar el bulto hacia adentro nuevamente si este no entra fcilmente.  Observe si la hernia cambia de forma, de color o de tamao. Solicite ayuda de inmediato si observa algn cambio.  Tome los medicamentos de venta libre y los recetados solamente como se lo haya indicado el mdico.  Concurra a todas las visitas de control como se lo haya indicado el mdico. Esto es importante. Comunquese con un mdico si:  Tiene fiebre.  Presenta nuevos sntomas.  Los sntomas empeoran. Solicite ayuda de inmediato si:  Tiene dolor en la ingle que comienza repentinamente y empeora.  Tiene un bulto en la ingle que tiene las siguientes caractersticas: ? Se agranda de repente y no se encoge. ? Se vuelve rojo o prpura y causa dolor al tacto.  Si es hombre y tiene un dolor repentino en el escroto o el tamao de este cambia de repente.  No puede volver a colocar la hernia en su lugar al ejercer sobre esta una presin muy suave mientras est acostado. No intente forzar el bulto hacia adentro nuevamente si este no entra fcilmente.  Tiene nuseas o vmitos que no desaparecen.  La frecuencia cardaca est acelerada.  Tiene dificultad para defecar o eliminar gases. Estos sntomas pueden representar un problema grave que constituye una emergencia. No espere hasta que los sntomas desaparezcan. Solicite atencin mdica de inmediato. Comunquese con el servicio de emergencias de su localidad (911 en los [place]). Resumen  Una hernia inguinal se produce cuando la grasa o los intestinos empujan a travs de una zona dbil de un msculo donde se unen la pierna y el abdomen inferior (ingle).  Esta afeccin ocurre cuando tiene una zona dbil en los msculos o en los tejidos de la ingle.  Los sntomas pueden depender del tamao de la hernia,  y pueden incluir dolor o hinchazn de la ingle. Con frecuencia, una pequea hernia inguinal no tiene sntomas.  Si no tiene sntomas, es posible que no necesite tratamiento. Si tiene sntomas o una hernia grande, es posible que necesite ciruga para reparar la hernia.  Evite levantar objetos pesados. Tambin evite estar de pie durante mucho tiempo. Esta informacin no tiene como fin reemplazar el consejo del mdico. Asegrese de hacerle al mdico cualquier pregunta que tenga. Document Released: 10/02/2012 Document Revised: 07/31/2017 Document Reviewed: 05/25/2017 Elsevier Patient Education  2020 [employer]  

## 2019-02-01 NOTE — Progress Notes (Signed)
Troy Harrison; [id number]; August 28, 1960   HPI Patient is a 58 year old Hispanic male who was referred to my care by Dr. Ronne BinningMcKenzie of urology for evaluation treatment of bilateral inguinal hernias.  Patient states that he has had intermittent groin pain when lifting something heavy for some time now.  He does not state whether the left or right side is worse.  He does not notice a lump.  He denies any nausea or vomiting.  He has 0 out of 10 upper abdominal pain.  The pain stays localized to the groin and does not radiate.  History is obtained through an interpreter as the patient understands little [place]. Past Medical History:  Diagnosis Date  . Enlarged prostate     Past Surgical History:  Procedure Laterality Date  . COLONOSCOPY N/A 04/26/2016   Procedure: COLONOSCOPY;  Surgeon: West BaliSandi L Fields, MD;  Location: [facility];  Service: Endoscopy;  Laterality: N/A;  1200  . CYSTOSCOPY WITH INSERTION OF UROLIFT N/A 01/02/2018   Procedure: CYSTOSCOPY WITH INSERTION OF UROLIFT;  Surgeon: Malen GauzeMcKenzie, Patrick L, MD;  Location: [facility];  Service: Urology;  Laterality: N/A;    Family History  Problem Relation Age of Onset  . Colon cancer Neg Hx     Current Outpatient Medications on File Prior to Visit  Medication Sig Dispense Refill  . acetaminophen (TYLENOL) 500 MG tablet Take 500 mg by mouth every 6 (six) hours as needed.    . cyclobenzaprine (FLEXERIL) 10 MG tablet Take 1 tablet (10 mg total) by mouth 2 (two) times daily as needed for muscle spasms. (Patient not taking: Reported on 02/01/2019) 20 tablet 0  . ibuprofen (ADVIL,MOTRIN) 600 MG tablet Take 1 tablet (600 mg total) by mouth every 8 (eight) hours as needed. (Patient not taking: Reported on 02/01/2019) 30 tablet 1   No current facility-administered medications on file prior to visit.     Allergies  Allergen Reactions  . Bactrim [Sulfamethoxazole-Trimethoprim] Itching    Social History   Substance and Sexual Activity  Alcohol  Use No    Social History   Tobacco Use  Smoking Status Former Smoker  . Quit date: 04/26/1989  . Years since quitting: 29.7  Smokeless Tobacco Never Used    Review of Systems  Constitutional: Negative.   HENT: Negative.   Eyes: Negative.   Respiratory: Negative.   Cardiovascular: Negative.   Gastrointestinal: Negative.   Genitourinary: Negative.   Musculoskeletal: Negative.   Skin: Negative.   Neurological: Negative.   Endo/Heme/Allergies: Negative.   Psychiatric/Behavioral: Negative.     Objective   Vitals:   02/01/19 0958  BP: 121/71  Pulse: 72  Resp: 16  Temp: 98.7 F (37.1 C)  SpO2: 98%    Physical Exam Vitals signs reviewed.  Constitutional:      Appearance: Normal appearance. He is normal weight. He is not ill-appearing.  HENT:     Head: Normocephalic and atraumatic.  Cardiovascular:     Rate and Rhythm: Normal rate and regular rhythm.     Heart sounds: Normal heart sounds. No murmur. No friction rub. No gallop.   Pulmonary:     Effort: Pulmonary effort is normal. No respiratory distress.     Breath sounds: Normal breath sounds. No stridor. No wheezing, rhonchi or rales.  Abdominal:     General: Abdomen is flat. There is no distension.     Palpations: Abdomen is soft. There is no mass.     Tenderness: There is no abdominal tenderness. There is no guarding  or rebound.     Hernia: A hernia is present.     Comments: A small right inguinal hernia is present.  He does have tenderness over the internal ring.  There is laxity of the left inguinal region.  No tenderness was elicited.  Hernia examination was more effective while patient was standing.  Genitourinary:    Comments: Genitourinary examination is within normal limits. Skin:    General: Skin is warm and dry.  Neurological:     Mental Status: He is alert and oriented to person, place, and time.   Urology notes reviewed.  Assessment  Small right inguinal hernia, laxity of left inguinal  region Plan   It appears the patient has more muscular discomfort due to heavy lifting then he does having pain due to the inguinal hernia.  Through the interpreter, I told the patient that I would not recommend surgical intervention at this time as the hernia is very small and he has never noticed swelling in the inguinal regions.  I did discuss with him that should he develop increased swelling and pain, he should return to my office.  I did tell him to avoid any significant heavy lifting when bending over.  All questions were answered.  Literature was given.  Follow-up as needed.

## 2019-03-21 ENCOUNTER — Ambulatory Visit (INDEPENDENT_AMBULATORY_CARE_PROVIDER_SITE_OTHER): Payer: Self-pay | Admitting: Urology

## 2019-03-21 DIAGNOSIS — R3914 Feeling of incomplete bladder emptying: Secondary | ICD-10-CM

## 2019-03-21 DIAGNOSIS — N401 Enlarged prostate with lower urinary tract symptoms: Secondary | ICD-10-CM

## 2019-03-21 DIAGNOSIS — R351 Nocturia: Secondary | ICD-10-CM

## 2019-03-21 MED FILL — SOLIFENACIN SUCCINATE 5 MG: 5 | 30 days supply | Qty: 30 | Fill #0

## 2019-04-20 ENCOUNTER — Other Ambulatory Visit: Payer: Self-pay

## 2019-04-20 ENCOUNTER — Ambulatory Visit (HOSPITAL_BASED_OUTPATIENT_CLINIC_OR_DEPARTMENT_OTHER): Payer: Self-pay | Admitting: Internal Medicine

## 2019-04-25 ENCOUNTER — Ambulatory Visit: Payer: Self-pay | Attending: Family Medicine | Admitting: Physician Assistant

## 2019-04-25 ENCOUNTER — Other Ambulatory Visit: Payer: Self-pay

## 2019-04-25 VITALS — BP 156/80 | HR 61 | Temp 97.9°F | Ht 69.0 in | Wt 160.2 lb

## 2019-04-25 DIAGNOSIS — R42 Dizziness and giddiness: Secondary | ICD-10-CM

## 2019-04-25 DIAGNOSIS — Z13220 Encounter for screening for lipoid disorders: Secondary | ICD-10-CM

## 2019-04-25 DIAGNOSIS — R030 Elevated blood-pressure reading, without diagnosis of hypertension: Secondary | ICD-10-CM

## 2019-04-25 DIAGNOSIS — Z789 Other specified health status: Secondary | ICD-10-CM

## 2019-04-25 DIAGNOSIS — Z131 Encounter for screening for diabetes mellitus: Secondary | ICD-10-CM

## 2019-04-25 NOTE — Progress Notes (Signed)
Patient ID: Troy Harrison, male   DOB: May 17, 1961, 58 y.o.   MRN: [id number]      Troy Harrison, is a 58 y.o. male  OIZ:[id number]  [id number]  DOB - 07-12-1960  Subjective:  Chief Complaint and HPI: Troy Harrison is a 58 y.o. male here today After work and in the afternoon he starts to have dizziness.  It only  The dizziness is fleeting and happens when he changes positions.  No HA/CP/vision changes/SOB.   This has been going on for about 1 month.  He is concerned about BP and wants to be checked for diabetes. He admits to poor water intake.  No loss of balance/change in equilibrium.  No melena/hematochezia or change in BM.    Interpreter "[place]" translating   ROS:   Constitutional:  No f/c, No night sweats, No unexplained weight loss. EENT:  No vision changes, No blurry vision, No hearing changes. No mouth, throat, or ear problems.  Respiratory: No cough, No SOB Cardiac: No CP, no palpitations GI:  No abd pain, No N/V/D. GU: No Urinary s/sx Musculoskeletal: No joint pain Neuro: No headache, + dizziness, no motor weakness.  Skin: No rash Endocrine:  No polydipsia. No polyuria.  Psych: Denies SI/HI  No problems updated.  ALLERGIES: Allergies  Allergen Reactions  . Bactrim [Sulfamethoxazole-Trimethoprim] Itching    PAST MEDICAL HISTORY: Past Medical History:  Diagnosis Date  . Enlarged prostate     MEDICATIONS AT HOME: Prior to Admission medications   Medication Sig Start Date End Date Taking? Authorizing Provider  acetaminophen (TYLENOL) 500 MG tablet Take 500 mg by mouth every 6 (six) hours as needed.    [provider]  cyclobenzaprine (FLEXERIL) 10 MG tablet Take 1 tablet (10 mg total) by mouth 2 (two) times daily as needed for muscle spasms. Patient not taking: Reported on 02/01/2019 11/02/18   Merrilee Jansky, MD  ibuprofen (ADVIL,MOTRIN) 600 MG tablet Take 1 tablet (600 mg total) by mouth every 8 (eight) hours as needed.  Patient not taking: Reported on 02/01/2019 05/30/18   Marcine Matar, MD     Objective:  EXAM:   Vitals:   04/25/19 1403  BP: (!) 156/80  Pulse: 61  Temp: 97.9 F (36.6 C)  TempSrc: Oral  SpO2: 100%  Weight: 160 lb 3.2 oz (72.7 kg)  Height: 5\' 9"  (1.753 m)    General appearance : A&OX3. NAD. Non-toxic-appearing HEENT: Atraumatic and Normocephalic.  PERRLA. EOM intact.  TM full B. Neck: supple, no JVD. No cervical lymphadenopathy. No thyromegaly Chest/Lungs:  Breathing-non-labored, Good air entry bilaterally, breath sounds normal without rales, rhonchi, or wheezing  CVS: S1 S2 regular, no murmurs, gallops, rubs  Extremities: Bilateral Lower Ext shows no edema, both legs are warm to touch with = pulse throughout Neurology:  CN II-XII grossly intact, Non focal.  Normal gait and ambulation.  Neg Rhomberg Psych:  TP linear. J/I WNL. Normal speech. Appropriate eye contact and affect.  Skin:  No Rash  Data Review Lab Results  Component Value Date   HGBA1C 5.6 08/25/2017   HGBA1C 5.5 11/08/2016     Assessment & Plan   1. Dizziness No neurological red flags and no cardiac S/sx.  Increase water intake - TSH - Comprehensive metabolic panel - CBC with Differential/Platelet -call 911 if any focal neurological s/sx develop or CP/SOB  2. Screening, lipid  - Lipid panel  3. Elevated BP without diagnosis of hypertension Check bp OOO and record 3 times weekly and bring to next  visit - Comprehensive metabolic panel  4. Screening for diabetes mellitus I have had a lengthy discussion and provided education about insulin resistance and the intake of too much sugar/refined carbohydrates.  I have advised the patient to work at a goal of eliminating sugary drinks, candy, desserts, sweets, refined sugars, processed foods, and white carbohydrates.  The patient expresses understanding.  - Hemoglobin A1c  5. Language barrier stratus interpreters used and additional time performing  visit was required.    Patient have been counseled extensively about nutrition and exercise  Return in about 1 month (around 05/25/2019) for with PCP.  The patient was given clear instructions to go to ER or return to medical center if symptoms don't improve, worsen or new problems develop. The patient verbalized understanding. The patient was told to call to get lab results if they haven't heard anything in the next week.     Freeman Caldron, PA-C [facility] and [place], [place]   04/25/2019, 2:26 PM

## 2019-04-25 NOTE — Patient Instructions (Signed)
Check Blood pressure 3 times weekly and record and bring to next visit.     Mareos Dizziness Los mareos son un problema muy frecuente. Causan sensacin de inestabilidad o de desvanecimiento. Puede sentir que se va a desmayar. Los [employer] Financial pueden provocarle una lesin si se tropieza o se cae. La causa puede deberse a [employer] Energy Corporation, tales como los siguientes:  Medicamentos.  No tener suficiente agua en el cuerpo (deshidratacin).  Enfermedad. Siga estas indicaciones en su casa: Comida y bebida   Beba suficiente lquido para mantener el pis (orina) claro o de color amarillo plido. Esto evita la deshidratacin. Trate de beber ms lquidos transparentes, como agua.  No beba alcohol.  Limite la cantidad de cafena que bebe o come si el mdico se lo indica.  Limite la cantidad de sal (sodio) que bebe o come si el mdico se lo indica. Actividad   Evite los movimientos rpidos. ? Cuando se levante de una silla, sujtese hasta sentirse bien. ? Por la maana, sintese primero a un lado de la cama. Cuando se sienta bien, pngase lentamente de pie mientras se sostiene de algo. Haga esto hasta que se sienta seguro en cuanto al equilibrio.  Mueva las piernas con frecuencia si debe estar de pie en un lugar durante mucho tiempo. Mientras est de pie, contraiga y relaje los msculos de las piernas.  No conduzca vehculos ni opere maquinaria pesada si se siente mareado.  Evite agacharse si se siente mareado. En su casa, coloque los objetos en algn lugar que le resulte fcil alcanzarlos sin agacharse. Estilo de vida  No consuma ningn producto que contenga nicotina o tabaco, como cigarrillos y Administrator, Civil Service. Si necesita ayuda para dejar de fumar, consulte al mdico.  Intente bajar el nivel de estrs. Para hacerlo, puede usar mtodos como el yoga o la meditacin. Hable con el mdico si necesita ayuda. Instrucciones generales  Controle sus mareos para ver si hay cambios.  Tome los  medicamentos de venta libre y los recetados solamente como se lo haya indicado el mdico. Hable con el mdico si cree que la causa de sus mareos es algn medicamento que est tomando.  Infrmele a un amigo o a un familiar si se siente mareado. Pdale a esta persona que llame al mdico si observa cambios en su comportamiento.  Concurra a todas las visitas de control como se lo haya indicado el mdico. Esto es importante. Comunquese con un mdico si:  Los American Express.  Los Golden [employer] Financial o la sensacin de Production assistant, radio.  Siente malestar estomacal (nuseas).  Tiene problemas para escuchar.  Aparecen nuevos sntomas.  Siente inestabilidad al estar de pie.  Siente que la Biochemist, clinical vueltas. Solicite ayuda de inmediato si:  Vomita o tiene heces acuosas (diarrea), y no puede comer o beber nada.  Tiene dificultad para hacer lo siguiente: ? Hablar. ? Caminar. ? Tragar. ? Usar los brazos, las manos o las piernas.  Se siente constantemente dbil.  No piensa con claridad o tiene dificultad para armar oraciones. Es posible que un amigo o un familiar adviertan que esto ocurre.  Tiene los siguientes sntomas: ? Journalist, newspaper. ? Dolor en el vientre (abdomen). ? Falta de aire. ? Sudoracin.  Cambios en la visin.  Sangrado.  Dolor de cabeza muy intenso.  Dolor o rigidez en el cuello.  Grant Ruts. Estos sntomas pueden Customer service manager. No espere hasta que los sntomas desaparezcan. Solicite atencin mdica de inmediato. Comunquese con el servicio de emergencias de su localidad (  911 en los [place]). No conduzca por sus propios medios OfficeMax Incorporatedhasta el hospital. Resumen  Los mareos causan sensacin de inestabilidad o de desvanecimiento. Puede sentir que se va a desmayar.  Beba suficiente lquido para mantener el pis (orina) claro o de color amarillo plido. No beba alcohol.  Evite los movimientos rpidos si se siente mareado.  Controle sus mareos para ver si  hay cambios. Esta informacin no tiene Theme park managercomo fin reemplazar el consejo del mdico. Asegrese de hacerle al mdico cualquier pregunta que tenga. Document Released: 05/27/2011 Document Revised: 12/09/2016 Document Reviewed: 12/09/2016 Elsevier Patient Education  2020 Elsevier Inc. Hipertensin en los adultos Hypertension, Adult La presin arterial alta (hipertensin) se produce cuando la fuerza de la sangre bombea a travs de las arterias con mucha fuerza. Las arterias son los vasos sanguneos que transportan la sangre desde el corazn al resto del cuerpo. La hipertensin hace que el corazn haga ms esfuerzo para Insurance account managerbombear sangre y Sears Holdings Corporationpuede provocar que las arterias se Armed forces training and education officerestrechen o Multimedia programmerendurezcan. La hipertensin no tratada o no controlada puede causar infarto de miocardio, insuficiencia cardaca, accidente cerebrovascular, enfermedad renal y otros problemas. Una lectura de la presin arterial consta de un nmero ms alto sobre un nmero ms bajo. En condiciones ideales, la presin arterial debe estar por debajo de 120/80. El Gafferprimer nmero (superior) es la presin sistlica. Es la medida de la presin de las arterias cuando el corazn late. El segundo nmero (inferior) es la presin diastlica. Es la medida de la presin en las arterias cuando el corazn se relaja. Cules son las causas? Se desconoce la causa exacta de esta afeccin. Hay algunas afecciones que causan presin arterial alta o estn relacionadas con ella. Qu incrementa el riesgo? Algunos factores de riesgo de hipertensin estn bajo su control. Los siguientes factores pueden hacer que sea ms propenso a Aeronautical engineerdesarrollar esta afeccin:  Fumar.  Tener diabetes mellitus tipo 2, colesterol alto, o ambos.  No hacer la cantidad suficiente de actividad fsica o ejercicio.  Tener sobrepeso.  Consumir mucha grasa, azcar, caloras o sal (sodio) en su dieta.  Beber alcohol en exceso. Algunos factores de riesgo para la presin arterial alta pueden  ser difciles o imposibles de Multimedia programmercambiar. Algunos de estos factores son los siguientes:  Tener enfermedad renal crnica.  Tener antecedentes familiares de presin arterial alta.  Edad. Los riesgos aumentan con la edad.  Raza. El riesgo es mayor para las Statisticianpersonas afroamericanas.  Sexo. Antes de los 45aos, los hombres corren ms Goodyear Tireriesgo que las mujeres. Despus de los 65aos, las mujeres corren ms Lexmark Internationalriesgo que los hombres.  Tener apnea obstructiva del sueo.  Estrs. Cules son los signos o los sntomas? Es posible que la presin arterial alta puede no cause sntomas. La presin arterial muy alta (crisis hipertensiva) puede provocar:  Dolor de cabeza.  Ansiedad.  Falta de aire.  Hemorragia nasal.  Nuseas y vmitos.  Cambios en la visin.  Dolor de pecho intenso.  Convulsiones. Cmo se diagnostica? Esta afeccin se diagnostica al medir su presin arterial mientras se encuentra sentado, con el brazo apoyado sobre una superficie plana, las piernas sin cruzar y los pies bien apoyados en el piso. El brazalete del tensimetro debe colocarse directamente sobre la piel de la parte superior del brazo y al nivel de su corazn. Debe medirla al Lake Ambulatory Surgery Ctrmenos dos veces en el mismo brazo. Determinadas condiciones pueden causar una diferencia de presin arterial entre el brazo izquierdo y Aeronautical engineerel derecho. Ciertos factores pueden provocar que las lecturas de la presin arterial  sean inferiores o superiores a lo normal por un perodo corto de tiempo:  Si su presin arterial es ms alta cuando se encuentra en el consultorio del mdico que cuando la mide en su hogar, se denomina hipertensin de bata blanca. La [name] de las personas que tienen esta afeccin no deben ser Engelhard Corporation.  Si su presin arterial es ms alta en el hogar que cuando se encuentra en el consultorio del mdico, se denomina hipertensin enmascarada. La [name] de las personas que tienen esta afeccin deben ser medicadas para Chief Operating Officer  la presin arterial. Si tiene una lecturas de presin arterial alta durante una visita o si tiene presin arterial normal con otros factores de riesgo, se le podr pedir que haga lo siguiente:  Que regrese otro da para volver a Chief Operating Officer su presin arterial nuevamente.  Que se controle la presin arterial en su casa durante 1 semana o ms. Si se le diagnostica hipertensin, es posible que se le realicen otros anlisis de sangre o estudios de diagnstico por imgenes para ayudar a su mdico a comprender su riesgo general de tener otras afecciones. Cmo se trata? Esta afeccin se trata haciendo cambios saludables en el estilo de vida, tales como ingerir alimentos saludables, realizar ms ejercicio y reducir el consumo de alcohol. El mdico puede recetarle medicamentos si los cambios en el estilo de vida no son suficientes para Museum/gallery curator la presin arterial y si:  Su presin arterial sistlica est por encima de 130.  Su presin arterial diastlica est por encima de 80. La presin arterial deseada puede variar en funcin de las enfermedades, la edad y otros factores personales. Siga estas instrucciones en su casa: Comida y bebida   Siga una dieta con alto contenido de fibras y [place], y con bajo contenido de sodio, International aid/development worker agregada y Neurosurgeon. Un ejemplo de plan alimenticio es la dieta DASH (Dietary Approaches to Stop Hypertension, Mtodos alimenticios para detener la hipertensin). Para alimentarse de esta manera: ? Coma mucha fruta y verdura fresca. Trate de que la mitad del plato de cada comida sea de frutas y verduras. ? Coma cereales integrales, como pasta integral, arroz integral o pan integral. Llene aproximadamente un cuarto del plato con cereales integrales. ? Coma y beba productos lcteos con bajo contenido de grasa, como leche descremada o yogur bajo en grasas. ? Evite la ingesta de cortes de carne grasa, carne procesada o curada, y carne de ave con piel. Llene aproximadamente  un cuarto del plato con protenas magras, como pescado, pollo sin piel, frijoles, huevos o tofu. ? Evite ingerir alimentos prehechos y procesados. En general, estos tienen mayor cantidad de sodio, azcar agregada y Steffanie [place].  Reduzca su ingesta diaria de sodio. La mayora de las personas que tienen hipertensin deben comer menos de 1500 mg de sodio por [employer] Worldwide.  No beba alcohol si: ? Su mdico le indica no hacerlo. ? Est embarazada, puede estar embarazada o est tratando de quedar embarazada.  Si bebe alcohol: ? Limite la cantidad que bebe a lo siguiente:  De 0 a 1 medida por da para las mujeres.  De 0 a 2 medidas por da para los hombres. ? Est atento a la cantidad de alcohol que hay en las bebidas que toma. En los [place], una medida equivale a una botella de cerveza de 12oz ( ), un vaso de vino de 5oz ( ) o un vaso de una bebida alcohlica de alta graduacin de 1oz (81ml). Estilo de vida   Trabaje con su mdico para Pharmacologist  un peso saludable o perder [employer]. Pregntele cul es el peso recomendado para usted.  Haga al menos 71minutos de ejercicio la Hartford Financial de la [place]. Estas actividades pueden incluir caminar, nadar o andar en bicicleta.  Incluya ejercicios para fortalecer sus msculos (ejercicios de resistencia), como Pilates o levantamiento de pesas, como parte de su rutina semanal de ejercicios. Intente realizar 20minutos de este tipo de ejercicios al Solectron Corporation a la [place].  No consuma ningn producto que contenga nicotina o tabaco, como cigarrillos, cigarrillos electrnicos y tabaco de Higher education careers adviser. Si necesita ayuda para dejar de fumar, consulte al mdico.  Contrlese la presin arterial en su casa segn las indicaciones del mdico.  Concurra a todas las visitas de seguimiento como se lo haya indicado el mdico. Esto es importante. Medicamentos  Delphi de venta libre y los recetados solamente como se lo haya indicado el mdico.  Siga cuidadosamente las indicaciones. Los medicamentos para la presin arterial deben tomarse segn las indicaciones.  No omita las dosis de medicamentos para la presin arterial. Si lo hace, estar en riesgo de tener problemas y puede hacer que los medicamentos sean menos eficaces.  Pregntele a su mdico a qu efectos secundarios o reacciones a los Careers information officer. Comunquese con un mdico si:  Piensa que tiene una reaccin a un medicamento que est tomando.  Tiene dolores de cabeza frecuentes (recurrentes).  Se siente mareado.  Tiene hinchazn en los tobillos.  Tiene problemas de visin. Solicite ayuda inmediatamente si:  Siente un dolor de cabeza intenso o confusin.  Siente debilidad inusual o adormecimiento.  Siente que va a desmayarse.  Siente un dolor intenso en el pecho o el abdomen.  Vomita repetidas veces.  Tiene dificultad para respirar. Resumen  La hipertensin se produce cuando la sangre bombea en las arterias con mucha fuerza. Si esta afeccin no se controla, podra correr riesgo de tener complicaciones graves.  La presin arterial deseada puede variar en funcin de las enfermedades, la edad y otros factores personales. Para la Com[employer], una presin arterial normal es menor que 120/80.  La hipertensin se trata con cambios en el estilo de vida, medicamentos o una combinacin de [place]. Los McDonald's Corporation estilo de vida incluyen prdida de peso, ingerir alimentos sanos, seguir una dieta baja en sodio, hacer ms ejercicio y Environmental consultant consumo de alcohol. Esta informacin no tiene Marine scientist el consejo del mdico. Asegrese de hacerle al mdico cualquier pregunta que tenga. Document Released: 06/07/2005 Document Revised: 03/23/2018 Document Reviewed: 03/23/2018 Elsevier Patient Education  2020 [employer].

## 2019-04-26 LAB — CBC WITH DIFFERENTIAL/PLATELET
Basophils Absolute: 0 10*3/uL (ref 0.0–0.2)
Basos: 1 %
EOS (ABSOLUTE): 0.1 10*3/uL (ref 0.0–0.4)
Eos: 1 %
Hematocrit: 45.6 % (ref 37.5–51.0)
Hemoglobin: 15.6 g/dL (ref 13.0–17.7)
Immature Grans (Abs): 0 10*3/uL (ref 0.0–0.1)
Immature Granulocytes: 0 %
Lymphocytes Absolute: 2.1 10*3/uL (ref 0.7–3.1)
Lymphs: 35 %
MCH: 30.8 pg (ref 26.6–33.0)
MCHC: 34.2 g/dL (ref 31.5–35.7)
MCV: 90 fL (ref 79–97)
Monocytes Absolute: 0.4 10*3/uL (ref 0.1–0.9)
Monocytes: 7 %
Neutrophils Absolute: 3.3 10*3/uL (ref 1.4–7.0)
Neutrophils: 56 %
Platelets: 264 10*3/uL (ref 150–450)
RBC: 5.06 x10E6/uL (ref 4.14–5.80)
RDW: 12.8 % (ref 11.6–15.4)
WBC: 5.9 10*3/uL (ref 3.4–10.8)

## 2019-04-26 LAB — COMPREHENSIVE METABOLIC PANEL
ALT: 26 IU/L (ref 0–44)
AST: 30 IU/L (ref 0–40)
Albumin/Globulin Ratio: 1.9 (ref 1.2–2.2)
Albumin: 4.7 g/dL (ref 3.8–4.9)
Alkaline Phosphatase: 85 IU/L (ref 39–117)
BUN/Creatinine Ratio: 15 (ref 9–20)
BUN: 13 mg/dL (ref 6–24)
Bilirubin Total: 0.2 mg/dL (ref 0.0–1.2)
CO2: 24 mmol/L (ref 20–29)
Calcium: 9.3 mg/dL (ref 8.7–10.2)
Chloride: 99 mmol/L (ref 96–106)
Creatinine, Ser: 0.86 mg/dL (ref 0.76–1.27)
GFR calc Af Amer: 110 mL/min/{1.73_m2} (ref >59)
GFR calc non Af Amer: 95 mL/min/{1.73_m2} (ref >59)
Globulin, Total: 2.5 g/dL (ref 1.5–4.5)
Glucose: 83 mg/dL (ref 65–99)
Potassium: 4.1 mmol/L (ref 3.5–5.2)
Sodium: 142 mmol/L (ref 134–144)
Total Protein: 7.2 g/dL (ref 6.0–8.5)

## 2019-04-26 LAB — LIPID PANEL
Chol/HDL Ratio: 2.1 ratio (ref 0.0–5.0)
Cholesterol, Total: 199 mg/dL (ref 100–199)
HDL: 96 mg/dL (ref >39)
LDL Chol Calc (NIH): 92 mg/dL (ref 0–99)
Triglycerides: 57 mg/dL (ref 0–149)
VLDL Cholesterol Cal: 11 mg/dL (ref 5–40)

## 2019-04-26 LAB — HEMOGLOBIN A1C
Est. average glucose Bld gHb Est-mCnc: 108 mg/dL
Hgb A1c MFr Bld: 5.4 % (ref 4.8–5.6)

## 2019-04-26 LAB — TSH: TSH: 2.39 u[IU]/mL (ref 0.450–4.500)

## 2019-05-09 ENCOUNTER — Ambulatory Visit (INDEPENDENT_AMBULATORY_CARE_PROVIDER_SITE_OTHER): Payer: Self-pay | Admitting: Urology

## 2019-05-09 DIAGNOSIS — N401 Enlarged prostate with lower urinary tract symptoms: Secondary | ICD-10-CM

## 2019-05-09 DIAGNOSIS — R351 Nocturia: Secondary | ICD-10-CM

## 2019-05-10 MED FILL — SOLIFENACIN SUCCINATE 5 MG: 5 | 30 days supply | Qty: 30 | Fill #0

## 2019-05-10 MED FILL — ALFUZOSIN HCL ER 10 MG TAB: 10 | 30 days supply | Qty: 30 | Fill #0

## 2019-05-31 ENCOUNTER — Ambulatory Visit: Payer: Self-pay

## 2019-06-04 MED FILL — SOLIFENACIN SUCCINATE 5 MG: 5 | 90 days supply | Qty: 90 | Fill #1

## 2019-10-12 ENCOUNTER — Other Ambulatory Visit: Payer: Self-pay

## 2019-10-12 ENCOUNTER — Ambulatory Visit: Payer: Self-pay | Attending: Internal Medicine

## 2019-10-23 ENCOUNTER — Ambulatory Visit: Payer: Self-pay | Attending: Internal Medicine | Admitting: Internal Medicine

## 2019-10-23 ENCOUNTER — Other Ambulatory Visit: Payer: Self-pay

## 2019-10-23 DIAGNOSIS — I10 Essential (primary) hypertension: Secondary | ICD-10-CM

## 2019-10-23 NOTE — Progress Notes (Signed)
Virtual Visit via Telephone Note Due to current restrictions/limitations of in-office visits due to the COVID-19 pandemic, this scheduled clinical appointment was converted to a telehealth visit  I connected with Troy Harrison on 10/23/19 at 9:15 a.m by telephone and verified that I am speaking with the correct person using two identifiers. I am in my office.  The patient is at home.  Only the patient, myself and Vikki Ports from [employer] [phone]) participated in this encounter.  I discussed the limitations, risks, security and privacy concerns of performing an evaluation and management service by telephone and the availability of in person appointments. I also discussed with the patient that there may be a patient responsible charge related to this service. The patient expressed understanding and agreed to proceed.   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 Last seen 11/202.  On last visit, BP was elevated, PA discussed DASH diet and advised to check BP 3 x a wk and bring in #s on next visit.  Checked his BP at [facility]  For several days after that visit.    Some of his readings were: 135/81 P75, 156/80 P 61, 124/69 P 81 He tells me he has stopped worrying and does his best to eat healthy.  Does exercise regularly including sit ups.  However, sit-ups make inguinal hernia hurt at times.  He has seen surgeon Dr. Lovell Sheehan 01/2019 for the inguinal hernias.  Surgery not recommended at this time.  No CP, SOB, LE edema.   He plans to get COVID vaccine this wkend.  Outpatient Encounter Medications as of 10/23/2019  Medication Sig  . acetaminophen (TYLENOL) 500 MG tablet Take 500 mg by mouth every 6 (six) hours as needed.   No facility-administered encounter medications on file as of 10/23/2019.      Observations/Objective: Results for orders placed or performed in  visit on 04/25/19  TSH  Result Value Ref Range   TSH 2.390 0.450 - 4.500 uIU/mL  Comprehensive metabolic panel  Result Value Ref Range   Glucose 83 65 - 99 mg/dL   BUN 13 6 - 24 mg/dL   Creatinine, Ser 5.46 0.76 - 1.27 mg/dL   GFR calc non Af Amer 95 >59 mL/min/1.73   GFR calc Af Amer 110 >59 mL/min/1.73   BUN/Creatinine Ratio 15 9 - 20   Sodium 142 134 - 144 mmol/L   Potassium 4.1 3.5 - 5.2 mmol/L   Chloride 99 96 - 106 mmol/L   CO2 24 20 - 29 mmol/L   Calcium 9.3 8.7 - 10.2 mg/dL   Total Protein 7.2 6.0 - 8.5 g/dL   Albumin 4.7 3.8 - 4.9 g/dL   Globulin, Total 2.5 1.5 - 4.5 g/dL   Albumin/Globulin Ratio 1.9 1.2 - 2.2   Bilirubin Total 0.2 0.0 - 1.2 mg/dL   Alkaline Phosphatase 85 39 - 117 IU/L   AST 30 0 - 40 IU/L   ALT 26 0 - 44 IU/L  CBC with Differential/Platelet  Result Value Ref Range   WBC 5.9 3.4 - 10.8 x10E3/uL   RBC 5.06 4.14 - 5.80 x10E6/uL   Hemoglobin 15.6 13.0 - 17.7 g/dL   Hematocrit 27.0 35.0 - 51.0 %   MCV 90 79 - 97 fL   MCH 30.8 26.6 - 33.0 pg   MCHC 34.2 31.5 - 35.7 g/dL   RDW 09.3 81.8 - 29.9 %   Platelets 264 150 - 450 x10E3/uL   Neutrophils  56 Not Estab. %   Lymphs 35 Not Estab. %   Monocytes 7 Not Estab. %   Eos 1 Not Estab. %   Basos 1 Not Estab. %   Neutrophils Absolute 3.3 1.4 - 7.0 x10E3/uL   Lymphocytes Absolute 2.1 0.7 - 3.1 x10E3/uL   Monocytes Absolute 0.4 0.1 - 0.9 x10E3/uL   EOS (ABSOLUTE) 0.1 0.0 - 0.4 x10E3/uL   Basophils Absolute 0.0 0.0 - 0.2 x10E3/uL   Immature Granulocytes 0 Not Estab. %   Immature Grans (Abs) 0.0 0.0 - 0.1 x10E3/uL  Lipid panel  Result Value Ref Range   Cholesterol, Total 199 100 - 199 mg/dL   Triglycerides 57 0 - 149 mg/dL   HDL 96 >39 mg/dL   VLDL Cholesterol Cal 11 5 - 40 mg/dL   LDL Chol Calc (NIH) 92 0 - 99 mg/dL   Chol/HDL Ratio 2.1 0.0 - 5.0 ratio  Hemoglobin A1c  Result Value Ref Range   Hgb A1c MFr Bld 5.4 4.8 - 5.6 %   Est. average glucose Bld gHb Est-mCnc 108 mg/dL     Assessment and  Plan: 1. Essential hypertension -Patient previously controlled without medication.  2 of his reported blood pressure readings from November are above goal.  I have asked him to check the blood pressure at least twice a week for the next 2 weeks and bring in those readings to see the clinical pharmacist.  If blood pressure still remains above goal, then I would recommend starting him on amlodipine 5 mg daily.  Advised patient to continue DASH diet and regular exercise.  Advised to avoid doing the sit ups given his history of hernia.  2.  Advised to get the COVID-19 vaccine which he plans to do this weekend. Follow Up Instructions:  3 mths I discussed the assessment and treatment plan with the patient. The patient was provided an opportunity to ask questions and all were answered. The patient agreed with the plan and demonstrated an understanding of the instructions.   The patient was advised to call back or seek an in-person evaluation if the symptoms worsen or if the condition fails to improve as anticipated.  I provided 16 minutes of non-face-to-face time during this encounter.   Karle Plumber, MD

## 2019-11-07 ENCOUNTER — Encounter: Payer: Self-pay | Admitting: Urology

## 2019-11-07 ENCOUNTER — Ambulatory Visit (INDEPENDENT_AMBULATORY_CARE_PROVIDER_SITE_OTHER): Payer: Self-pay | Admitting: Urology

## 2019-11-07 ENCOUNTER — Other Ambulatory Visit: Payer: Self-pay

## 2019-11-07 VITALS — BP 145/92 | HR 72 | Temp 98.4°F | Ht 69.0 in | Wt 162.5 lb

## 2019-11-07 DIAGNOSIS — R351 Nocturia: Secondary | ICD-10-CM

## 2019-11-07 DIAGNOSIS — N401 Enlarged prostate with lower urinary tract symptoms: Secondary | ICD-10-CM

## 2019-11-07 DIAGNOSIS — R350 Frequency of micturition: Secondary | ICD-10-CM

## 2019-11-07 LAB — POCT URINALYSIS DIPSTICK
Bilirubin, UA: NEGATIVE
Blood, UA: NEGATIVE
Glucose, UA: NEGATIVE
Ketones, UA: NEGATIVE
Leukocytes, UA: NEGATIVE
Nitrite, UA: NEGATIVE
Protein, UA: NEGATIVE
Spec Grav, UA: <=1.005 — AB (ref 1.010–1.025)
Urobilinogen, UA: NEGATIVE E.U./dL — AB
pH, UA: 7.5 (ref 5.0–8.0)

## 2019-11-07 NOTE — Patient Instructions (Signed)

## 2019-11-07 NOTE — Progress Notes (Signed)
11/07/2019 10:20 AM   Venetia Night 1960-12-07 [id number]  Referring provider: Ladell Pier, MD [place],  [place] [address]  Nocturia  HPI: Mr Troy Harrison is a 59yo here for followup for BPH and nocturia. Since last visit his nocturia has improved to 1-2x. He has since stopped the vesicare and uroxatral. Since being treated for prostatitis last year his LUTS have become mild. urinary stream strong. Urinary frequency and urgency improved. No hematuria.   His records from [facility] are as follows: 02/23/2017: His nocturia has improved to 2-3x from 4-5x. His stream is stronger   05/25/2017: He stopped his uroxtral since last visit. His PVR today is 225cc. He has worsening LUTS since stopping the uroxatral.   08/24/2017: He has significant improvement in his LUTS on finasteride and flomax except for nocturia 3-4x   10/12/2017: PVR is 14cc. nocturia improved to 3x. He has dizziness and drymouth with flomax and oxybutynin. He started taking the flomax in the morning due to drymouth.   11/23/2017: He has stable LUTS on finasteride, flomax and mirabegron. He noted no difference with mirabegron.   01/04/2018: s/p Urolift on 7/15. voiding trial passed today   01/18/2018: s/p Urolift. He was doing well until 3 days ago when he developed dysuria. NO urinary frequency or urgency. Nocturia is 3-4x.   02/08/2018: His LUTs significantly improved since last visit. no dysuria. no urgency   03/21/2019: He has stable LUTS since last visit. He biggest complaint is urinary urgency.   05/09/2019: Stream strong. urgency resolved.      PMH: Past Medical History:  Diagnosis Date  . Enlarged prostate     Surgical History: Past Surgical History:  Procedure Laterality Date  . COLONOSCOPY N/A 04/26/2016   Procedure: COLONOSCOPY;  Surgeon: Danie Binder, MD;  [facility];  Service: Endoscopy;  Laterality: N/A;  1200  . CYSTOSCOPY WITH INSERTION OF UROLIFT N/A 01/02/2018   Procedure: CYSTOSCOPY WITH INSERTION OF UROLIFT;  Surgeon: Cleon Gustin, MD;  Location: [facility];  Service: Urology;  Laterality: N/A;    Home Medications:  Allergies as of 11/07/2019      Reactions   Bactrim [sulfamethoxazole-trimethoprim] Itching      Medication List       Accurate as of Nov 07, 2019 10:20 AM. If you have any questions, ask your nurse or doctor.        acetaminophen 500 MG tablet Commonly known as: TYLENOL Take 500 mg by mouth every 6 (six) hours as needed.       Allergies:  Allergies  Allergen Reactions  . Bactrim [Sulfamethoxazole-Trimethoprim] Itching    Family History: Family History  Problem Relation Age of Onset  . Colon cancer Neg Hx     Social History:  reports that he quit smoking about 30 years ago. He has never used smokeless tobacco. He reports that he does not drink alcohol or use drugs.  ROS: All other review of systems were reviewed and are negative except what is noted above in HPI  Physical Exam: BP (!) 145/92   Pulse 72   Temp 98.4 F (36.9 C)   Ht 5\' 9"  (1.753 m)   Wt 162 lb 8 oz (73.7 kg)   BMI 24.00 kg/m   Constitutional:  Alert and oriented, No acute distress. HEENT: [place] AT, moist mucus membranes.  Trachea midline, no masses. Cardiovascular: No clubbing, cyanosis, or edema. Respiratory: Normal respiratory effort, no increased work of breathing. GI: Abdomen is soft, nontender, nondistended,  no abdominal masses GU: No CVA tenderness.  Lymph: No cervical or inguinal lymphadenopathy. Skin: No rashes, bruises or suspicious lesions. Neurologic: Grossly intact, no focal deficits, moving all 4 extremities. Psychiatric: Normal mood and affect.  Laboratory Data: Lab Results  Component Value Date   WBC 5.9 04/25/2019   HGB 15.6 04/25/2019   HCT 45.6 04/25/2019   MCV 90 04/25/2019   PLT 264 04/25/2019    Lab Results  Component Value Date   CREATININE 0.86 04/25/2019    Lab Results  Component Value Date   PSA  0.5 03/03/2016   PSA 0.50 12/11/2013    No results found for: TESTOSTERONE  Lab Results  Component Value Date   HGBA1C 5.4 04/25/2019    Urinalysis    Component Value Date/Time   COLORURINE YELLOW 12/11/2013 1546   APPEARANCEUR Clear 01/18/2019 1715   LABSPEC 1.015 01/12/2016 1338   PHURINE 7.5 01/12/2016 1338   GLUCOSEU Negative 01/18/2019 1715   HGBUR TRACE (A) 01/12/2016 1338   BILIRUBINUR neg 11/07/2019 1000   BILIRUBINUR Negative 01/18/2019 1715   KETONESUR negative 01/18/2019 1608   KETONESUR NEGATIVE 01/12/2016 1338   PROTEINUR Negative 11/07/2019 1000   PROTEINUR Trace 01/18/2019 1715   PROTEINUR NEGATIVE 01/12/2016 1338   UROBILINOGEN negative (A) 11/07/2019 1000   UROBILINOGEN 0.2 01/12/2016 1338   NITRITE neg 11/07/2019 1000   NITRITE Negative 01/18/2019 1715   NITRITE NEGATIVE 01/12/2016 1338   LEUKOCYTESUR Negative 11/07/2019 1000   LEUKOCYTESUR Negative 01/18/2019 1715    Lab Results  Component Value Date   BACTERIA NONE SEEN 12/11/2013    Pertinent Imaging:  No results found for this or any previous visit. No results found for this or any previous visit. No results found for this or any previous visit. No results found for this or any previous visit. No results found for this or any previous visit. No results found for this or any previous visit. No results found for this or any previous visit. No results found for this or any previous visit.  Assessment & Plan:    1. Benign prostatic hyperplasia with urinary frequency -COntinue observation since patient is not bothered by his LUTS - POCT urinalysis dipstick  2. Nocturia -fluid management -RTC 1 year   Return in about 6 months (around 05/09/2020).  Wilkie Aye, MD  [facility] [place]

## 2019-11-07 NOTE — Progress Notes (Signed)

## 2019-11-09 ENCOUNTER — Encounter: Payer: Self-pay | Admitting: Pharmacist

## 2019-11-09 ENCOUNTER — Ambulatory Visit: Payer: Self-pay | Attending: Internal Medicine | Admitting: Pharmacist

## 2019-11-09 ENCOUNTER — Other Ambulatory Visit: Payer: Self-pay

## 2019-11-09 VITALS — BP 127/71 | HR 73

## 2019-11-09 DIAGNOSIS — I10 Essential (primary) hypertension: Secondary | ICD-10-CM

## 2019-11-09 NOTE — Progress Notes (Signed)
   S:    Patient arrives in good spirits. Presents to the clinic for BP check.   Patient was referred and last seen by Primary Care Provider on 10/23/2019. Pt was seen via telemedicine and reported some above goal BP readings at home. He was asked to see me today for a BP check and if BP remains above goal, Dr. Laural Benes would like for [place] to begin amlodipine.    Medication adherence: pt does not currently take any medications for BP.  Current BP Medications include:  None   Antihypertensives tried in the past include: HCTZ  Dietary habits include: limits salt; drinks some coffee in the morning  Exercise habits include: limited d/t inguinal hernia Family / Social history:  - FHx: no pertinent positives - Tobacco: former smoker (quit in 1990) - Alcohol: none   O:  There were no vitals filed for this visit.  Home BP readings:  11/07/2019: 122/69, HR 72  11/08/2019: 116/68, HR 73  Last 3 Office BP readings: BP Readings from Last 3 Encounters:  11/07/19 (!) 145/92  04/25/19 (!) 156/80  02/01/19 121/71    BMET    Component Value Date/Time   NA 142 04/25/2019 1429   K 4.1 04/25/2019 1429   CL 99 04/25/2019 1429   CO2 24 04/25/2019 1429   GLUCOSE 83 04/25/2019 1429   GLUCOSE 81 12/28/2017 1230   BUN 13 04/25/2019 1429   CREATININE 0.86 04/25/2019 1429   CREATININE 0.78 03/03/2016 1015   CALCIUM 9.3 04/25/2019 1429   GFRNONAA 95 04/25/2019 1429   GFRNONAA >89 03/03/2016 1015   GFRAA 110 04/25/2019 1429   GFRAA >89 03/03/2016 1015   Renal function: CrCl cannot be calculated (Patient's most recent lab result is older than the maximum 21 days allowed.).  Clinical ASCVD: No  The 10-year ASCVD risk score Denman George DC Jr., et al., 2013) is: 6.3%   Values used to calculate the score:     Age: 59 years     Sex: Male     Is Non-Hispanic African American: No     Diabetic: No     Tobacco smoker: No     Systolic Blood Pressure: 145 mmHg     Is BP treated: Yes     HDL Cholesterol: 67  mg/dL     Total Cholesterol: 134 mg/dL  A/P: Hypertension longstanding previously controlled with lifestyle. Pt has taken HCTZ before but is not currently on medications. BP Goal = < 130/80 mmHg. BP today and at home is at goal.  -No medication changes today.  -Counseled on lifestyle modifications for blood pressure control including reduced dietary sodium, increased exercise, adequate sleep.  Results reviewed and written information provided.   Total time in face-to-face counseling 15 minutes.   F/U Clinic Visit in Aug with Dr. Laural Benes.   Butch Penny, PharmD, CPP Clinical Pharmacist [facility] [phone]

## 2020-02-01 ENCOUNTER — Encounter: Payer: Self-pay | Admitting: Internal Medicine

## 2020-02-01 ENCOUNTER — Ambulatory Visit: Payer: Self-pay | Attending: Internal Medicine | Admitting: Internal Medicine

## 2020-02-01 ENCOUNTER — Other Ambulatory Visit: Payer: Self-pay

## 2020-02-01 VITALS — BP 121/76 | HR 82 | Temp 99.0°F | Resp 16 | Wt 163.6 lb

## 2020-02-01 DIAGNOSIS — K4020 Bilateral inguinal hernia, without obstruction or gangrene, not specified as recurrent: Secondary | ICD-10-CM

## 2020-02-01 DIAGNOSIS — N401 Enlarged prostate with lower urinary tract symptoms: Secondary | ICD-10-CM

## 2020-02-01 DIAGNOSIS — R350 Frequency of micturition: Secondary | ICD-10-CM

## 2020-02-01 NOTE — Progress Notes (Signed)
Patient ID: Troy Harrison, male    DOB: [birth year]  MRN: [id number]  CC: Hypertension   Subjective: Troy Harrison is a 59 y.o. male who presents for chronic ds management His concerns today include:  history of HTN(control ofmed), chronic midline thoracic back pain(OA and scoliosis), vitamin D deficiency,BPHand varicocele.  Last eval 10/2019 with telephone visit. He completed Moderna COVID-19 vaccine.  He has card with him.  Since last visit with me, patient did return to see the clinical pharmacist and brought in blood pressure readings with him.  His readings were good.  So the plan was to continue to observe blood pressure off of medications. -doing well with eating habits.  Exercises regularly.  No chest pains or shortness of breath.  He is sleeping well. Wonders if he is due for routine blood tests today.   Last wk, he had a little pain last wk from inguinal hernia but no issues this wk.  Had US done last yr via urologist that revealed BL small inguinal hernia.  Referred to general surgeon who recommended no surgical intervention at the time.  Saw his urologist Dr. Ronne Binning 11/07/2019.  He has BPH.  Plan is for continued observation.    Patient Active Problem List   Diagnosis Date Noted  . Nocturia 11/07/2019  . Weight loss, unintentional 08/25/2017  . 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 Chronic midline thoracic back pain 03/08/2017  . Benign prostatic hyperplasia with urinary frequency 12/06/2016  . Varicocele present on ultrasound of scrotum 12/06/2016  . Vitamin D deficiency 12/06/2016  . HTN (hypertension), benign 11/08/2016  . Constipation 12/17/2013  . Hemorrhoids, external without complications 12/17/2013  . Special screening for malignant neoplasms, colon 12/17/2013     Current Outpatient Medications on File Prior to Visit  Medication Sig Dispense Refill  . acetaminophen (TYLENOL) 500 MG tablet Take 500 mg by mouth every 6 (six) hours as  needed.     No current facility-administered medications on file prior to visit.    Allergies  Allergen Reactions  . Bactrim [Sulfamethoxazole-Trimethoprim] Itching    Social History   Socioeconomic History  . Marital status: Married    Spouse name: Not on file  . Number of children: Not on file  . Years of education: Not on file  . Highest education level: Not on file  Occupational History  . Not on file  Tobacco Use  . Smoking status: Former Smoker    Quit date: 04/26/1989    Years since quitting: 30.7  . Smokeless tobacco: Never Used  Vaping Use  . Vaping Use: Never used  Substance and Sexual Activity  . Alcohol use: No  . Drug use: No  . Sexual activity: Yes  Other Topics Concern  . Not on file  Social History Narrative  . Not on file   Social Determinants of Health   Financial Resource Strain:   . Difficulty of Paying Living Expenses:   Food Insecurity:   . Worried About Programme researcher, broadcasting/film/video in the Last Year:   . Barista in the Last Year:   Transportation Needs:   . Freight forwarder (Medical):   Marland Kitchen Lack of Transportation (Non-Medical):   Physical Activity:   . Days of Exercise per Week:   . Minutes of Exercise per Session:   Stress:   . Feeling of Stress :   Social Connections:   . Frequency of Communication with Friends and Family:   . Frequency of Social Gatherings  with Friends and Family:   . Attends Religious Services:   . Active Member of Clubs or Organizations:   . Attends Banker Meetings:   Marland Kitchen Marital Status:   Intimate Partner Violence:   . Fear of Current or Ex-Partner:   . Emotionally Abused:   Marland Kitchen Physically Abused:   . Sexually Abused:     Family History  Problem Relation Age of Onset  . Colon cancer Neg Hx     Past Surgical History:  Procedure Laterality Date  . COLONOSCOPY N/A 04/26/2016   Procedure: COLONOSCOPY;  Surgeon: West Bali, MD;  Location[facility];  Service: Endoscopy;  Laterality:  N/A;  1200  . CYSTOSCOPY WITH INSERTION OF UROLIFT N/A 01/02/2018   Procedure: CYSTOSCOPY WITH INSERTION OF UROLIFT;  Surgeon: Malen Gauze, MD;  Location: [facility];  Service: Urology;  Laterality: N/A;    ROS: Review of Systems Negative except as stated above  PHYSICAL EXAM: BP 121/76   Pulse 82   Temp 99 F (37.2 C)   Resp 16   Wt 163 lb 9.6 oz (74.2 kg)   SpO2 95%   BMI 24.16 kg/m   Wt Readings from Last 3 Encounters:  02/01/20 163 lb 9.6 oz (74.2 kg)  11/07/19 162 lb 8 oz (73.7 kg)  04/25/19 160 lb 3.2 oz (72.7 kg)    Physical Exam General appearance - alert, well appearing older Hispanic male, and in no distress Mental status - normal mood, behavior, speech, dress, motor activity, and thought processes Neck - supple, no significant adenopathy Chest - clear to auscultation, no wheezes, rales or rhonchi, symmetric air entry Heart - normal rate, regular rhythm, normal S1, S2, no murmurs, rubs, clicks or gallops GU Male -no inguinal hernia seen or palpated on exam.   Extremities - peripheral pulses normal, no pedal edema, no clubbing or cyanosis  CMP Latest Ref Rng & Units 04/25/2019 03/08/2018 12/28/2017  Glucose 65 - 99 mg/dL 83 90 81  BUN 6 - 24 mg/dL 13 15 18   Creatinine 0.76 - 1.27 mg/dL 4.62 7.03  Sodium 134 - 144 mmol/L 142 141 135  Potassium 3.5 - 5.2 mmol/L 4.1 4.6 3.8  Chloride 96 - 106 mmol/L 99 99 102  CO2 20 - 29 mmol/L 24 26 26   Calcium 8.7 - 10.2 mg/dL 9.3 9.3 5.00)  Total Protein 6.0 - 8.5 g/dL 7.2 6.6 -  Total Bilirubin 0.0 - 1.2 mg/dL 0.2 0.4 -  Alkaline Phos 39 - 117 IU/L 85 66 -  AST 0 - 40 IU/L 30 24 -  ALT 0 - 44 IU/L 26 25 -   Lipid Panel     Component Value Date/Time   CHOL 199 04/25/2019 1429   TRIG 57 04/25/2019 1429   HDL 96 04/25/2019 1429   CHOLHDL 2.1 04/25/2019 1429   CHOLHDL 2.1 03/14/2014 1220   VLDL 17 03/14/2014 1220   LDLCALC 92 04/25/2019 1429    CBC    Component Value Date/Time   WBC 5.9 04/25/2019 1429    WBC 5.0 12/28/2017 1230   RBC 5.06 04/25/2019 1429   RBC 4.87 12/28/2017 1230   HGB 15.6 04/25/2019 1429   HCT 45.6 04/25/2019 1429   PLT 264 04/25/2019 1429   MCV 90 04/25/2019 1429   MCH 30.8 04/25/2019 1429   MCH 30.6 12/28/2017 1230   MCHC 34.2 04/25/2019 1429   MCHC 33.6 12/28/2017 1230   RDW 12.8 04/25/2019 1429   LYMPHSABS 2.1 04/25/2019 1429  MONOABS 0.4 12/11/2013 1546   EOSABS 0.1 04/25/2019 1429   BASOSABS 0.0 04/25/2019 1429    ASSESSMENT AND PLAN: 1. Bilateral inguinal hernia without obstruction or gangrene, recurrence not specified -Not palpated on exam but present on imaging per urology.  Recommend avoid heavy lifting and sit ups.  2. Benign prostatic hyperplasia with urinary frequency Followed by urology.  Advised patient to return the end and part of next month as a nurse only visit to get his flu vaccine.     Patient was given the opportunity to ask questions.  Patient verbalized understanding of the plan and was able to repeat key elements of the plan.  [employer] Language servicer used during this encounter. #[id number]  No orders of the defined types were placed in this encounter.    Requested Prescriptions    No prescriptions requested or ordered in this encounter    Return in about 4 months (around 06/02/2020).  Jonah Blue, MD, FACP

## 2020-04-25 ENCOUNTER — Other Ambulatory Visit: Payer: Self-pay

## 2020-04-25 ENCOUNTER — Ambulatory Visit: Payer: Self-pay | Attending: Internal Medicine

## 2020-05-23 ENCOUNTER — Telehealth: Payer: Self-pay | Admitting: Internal Medicine

## 2020-05-23 ENCOUNTER — Other Ambulatory Visit: Payer: Self-pay

## 2020-05-23 ENCOUNTER — Ambulatory Visit: Payer: Self-pay | Attending: Internal Medicine | Admitting: Internal Medicine

## 2020-05-23 NOTE — Telephone Encounter (Signed)
Patient is call regarding his virtual appt. Today.  He is still waiting and has not been contacted.  Please call patient asap

## 2020-05-23 NOTE — Telephone Encounter (Signed)
Tried contact pt at 1114am. Pt didn't answer.

## 2020-06-02 ENCOUNTER — Telehealth: Payer: Self-pay | Admitting: Internal Medicine

## 2020-07-15 ENCOUNTER — Other Ambulatory Visit: Payer: Self-pay

## 2020-07-15 ENCOUNTER — Ambulatory Visit: Payer: Self-pay | Attending: Internal Medicine | Admitting: Internal Medicine

## 2020-07-15 DIAGNOSIS — N401 Enlarged prostate with lower urinary tract symptoms: Secondary | ICD-10-CM

## 2020-07-15 DIAGNOSIS — R030 Elevated blood-pressure reading, without diagnosis of hypertension: Secondary | ICD-10-CM

## 2020-07-15 DIAGNOSIS — R350 Frequency of micturition: Secondary | ICD-10-CM

## 2020-07-15 DIAGNOSIS — Z23 Encounter for immunization: Secondary | ICD-10-CM

## 2020-07-15 NOTE — Progress Notes (Signed)
Virtual Visit via Telephone Note  I connected with Troy Harrison on 07/15/20 at  1:30 PM EST by telephone and verified that I am speaking with the correct person using two identifiers.  Location: Patient: home Provider: office  The patient, my CMA Ms. Julius Bowels, myself and Delia Heady (([id number]) from [facility] interpreters participated in this encounter.  I discussed the limitations, risks, security and privacy concerns of performing an evaluation and management service by telephone and the availability of in person appointments. I also discussed with the patient that there may be a patient responsible charge related to this service. The patient expressed understanding and agreed to proceed.   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  Patient last seen 01/2020.  Today's visit is for chronic ds management.  Pt requesting baseline blood test since it has been over 1 yr since last check.  Also request urine check. Denies dysuria. Passing urine okay. Gets up 1-2 x/night to urinate. He remains of med for BP.  No device  Has a treadmill and plans to start exercising this wk. no chest pains or shortness of breath.  No headaches or dizziness.  HM:  Has had J&J and Moderna COVID-19 vaccine.  He has not had flu vaccine    Outpatient Encounter Medications as of 07/15/2020  Medication Sig  . acetaminophen (TYLENOL) 500 MG tablet Take 500 mg by mouth every 6 (six) hours as needed.   No facility-administered encounter medications on file as of 07/15/2020.      Observations/Objective: No direct observation done as this was a telephone visit.  Assessment and Plan: 1. Benign prostatic hyperplasia with urinary frequency Patient doing well without much lower tract symptoms.  2. Elevated BP without diagnosis of hypertension Encouraged to continue low-salt diet and incorporate fresh  fruits and vegetables into the diet.  He will come next week for his blood test. - CBC; Future - Comprehensive metabolic panel; Future - Lipid panel; Future  3. Need for influenza vaccination Patient to see clinical pharmacist next week to get flu shot.   Follow Up Instructions: 4 months.   I discussed the assessment and treatment plan with the patient. The patient was provided an opportunity to ask questions and all were answered. The patient agreed with the plan and demonstrated an understanding of the instructions.   The patient was advised to call back or seek an in-person evaluation if the symptoms worsen or if the condition fails to improve as anticipated.  I provided 15 minutes of non-face-to-face time during this encounter.   Jonah Blue, MD

## 2020-07-16 ENCOUNTER — Telehealth: Payer: Self-pay | Admitting: Internal Medicine

## 2020-07-16 DIAGNOSIS — K0889 Other specified disorders of teeth and supporting structures: Secondary | ICD-10-CM

## 2020-07-16 NOTE — Addendum Note (Signed)
Addended by: Jonah Blue B on: 07/16/2020 09:39 PM   Modules accepted: Orders

## 2020-07-16 NOTE — Telephone Encounter (Signed)
Will forward to pcp

## 2020-07-16 NOTE — Telephone Encounter (Signed)
Patient called and I connected with interpreter to assist patient.   Patient called complaining of pain and discomfort in one of his teeth when eating and requested a dental referral. Patient has CAFA 100% and has OC. Patient stated he needed to see a dentist ASAP. Please advise.

## 2020-07-21 ENCOUNTER — Other Ambulatory Visit: Payer: Self-pay

## 2020-07-21 ENCOUNTER — Ambulatory Visit: Payer: Self-pay | Attending: Internal Medicine

## 2020-07-21 DIAGNOSIS — R030 Elevated blood-pressure reading, without diagnosis of hypertension: Secondary | ICD-10-CM

## 2020-07-22 LAB — COMPREHENSIVE METABOLIC PANEL
ALT: 19 IU/L (ref 0–44)
AST: 23 IU/L (ref 0–40)
Albumin/Globulin Ratio: 1.7 (ref 1.2–2.2)
Albumin: 4.3 g/dL (ref 3.8–4.9)
Alkaline Phosphatase: 62 IU/L (ref 44–121)
BUN/Creatinine Ratio: 12 (ref 10–24)
BUN: 11 mg/dL (ref 8–27)
Bilirubin Total: 0.2 mg/dL (ref 0.0–1.2)
CO2: 24 mmol/L (ref 20–29)
Calcium: 8.7 mg/dL (ref 8.6–10.2)
Chloride: 100 mmol/L (ref 96–106)
Creatinine, Ser: 0.95 mg/dL (ref 0.76–1.27)
GFR calc Af Amer: 100 mL/min/{1.73_m2} (ref >59)
GFR calc non Af Amer: 87 mL/min/{1.73_m2} (ref >59)
Globulin, Total: 2.5 g/dL (ref 1.5–4.5)
Glucose: 94 mg/dL (ref 65–99)
Potassium: 4 mmol/L (ref 3.5–5.2)
Sodium: 137 mmol/L (ref 134–144)
Total Protein: 6.8 g/dL (ref 6.0–8.5)

## 2020-07-22 LAB — CBC
Hematocrit: 46.9 % (ref 37.5–51.0)
Hemoglobin: 16 g/dL (ref 13.0–17.7)
MCH: 29.8 pg (ref 26.6–33.0)
MCHC: 34.1 g/dL (ref 31.5–35.7)
MCV: 87 fL (ref 79–97)
Platelets: 218 10*3/uL (ref 150–450)
RBC: 5.37 x10E6/uL (ref 4.14–5.80)
RDW: 12.3 % (ref 11.6–15.4)
WBC: 3.9 10*3/uL (ref 3.4–10.8)

## 2020-07-22 LAB — LIPID PANEL
Chol/HDL Ratio: 2.6 ratio (ref 0.0–5.0)
Cholesterol, Total: 201 mg/dL — ABNORMAL HIGH (ref 100–199)
HDL: 77 mg/dL (ref >39)
LDL Chol Calc (NIH): 108 mg/dL — ABNORMAL HIGH (ref 0–99)
Triglycerides: 92 mg/dL (ref 0–149)
VLDL Cholesterol Cal: 16 mg/dL (ref 5–40)

## 2020-07-23 ENCOUNTER — Telehealth: Payer: Self-pay

## 2020-07-23 NOTE — Telephone Encounter (Signed)
[employer] interpre[employer] [place]aac  Id# [phone]  contacted pt to go over lab results pt didn't answer lvm

## 2020-07-23 NOTE — Telephone Encounter (Addendum)
See Result Notes   Copied from CRM [id number]. Topic: Quick Communication - Lab Results (Clinic Use ONLY) >> Jul 23, 2020  3:14 PM Particia Lather, [place] wrote: [facility] interpreters isaac  Id# [id number]  contacted pt to go over lab results pt didn't answer lvm. If pt calls back please give results >> Jul 23, 2020  3:44 PM Tamela Oddi wrote: Patient is returning a call regarding his labs.  Tried [facility] but they were busy on another call.  Please call patient back at [phone]

## 2020-07-23 NOTE — Progress Notes (Signed)
Let patient know that his LDL cholesterol is 108 with goal being less than 100. Healthy eating habits and regular exercise like walking 3 to 4 days a week for 30 minutes will help to lower cholesterol. Kidney and liver function tests are normal. Blood cell counts including red blood cell, white blood cell and platelet counts are normal.

## 2020-11-05 ENCOUNTER — Ambulatory Visit: Payer: Self-pay | Admitting: Urology

## 2020-11-13 ENCOUNTER — Ambulatory Visit: Payer: Self-pay | Admitting: Internal Medicine

## 2020-11-21 ENCOUNTER — Ambulatory Visit: Payer: Self-pay | Admitting: Internal Medicine

## 2020-11-24 ENCOUNTER — Ambulatory Visit: Payer: Self-pay | Attending: Internal Medicine

## 2020-11-24 ENCOUNTER — Other Ambulatory Visit: Payer: Self-pay

## 2020-11-27 ENCOUNTER — Other Ambulatory Visit: Payer: Self-pay

## 2020-11-27 ENCOUNTER — Ambulatory Visit: Payer: Self-pay | Attending: Internal Medicine

## 2020-12-26 ENCOUNTER — Other Ambulatory Visit: Payer: Self-pay

## 2020-12-26 ENCOUNTER — Ambulatory Visit: Payer: Self-pay | Attending: Internal Medicine | Admitting: Internal Medicine

## 2020-12-26 VITALS — BP 113/70 | HR 73 | Temp 98.3°F | Resp 16 | Wt 156.0 lb

## 2020-12-26 DIAGNOSIS — R682 Dry mouth, unspecified: Secondary | ICD-10-CM

## 2020-12-26 DIAGNOSIS — R252 Cramp and spasm: Secondary | ICD-10-CM

## 2020-12-26 DIAGNOSIS — N50812 Left testicular pain: Secondary | ICD-10-CM

## 2020-12-26 MED ORDER — METHOCARBAMOL 500 MG PO TABS
500.0000 mg | ORAL_TABLET | Freq: Every evening | ORAL | 0 refills | Status: DC | PRN
  Filled 2020-12-26: qty 30, 30d supply, fill #0

## 2020-12-26 NOTE — Progress Notes (Signed)
Dry mouth Cramps in hands, fingers, and legs

## 2020-12-26 NOTE — Progress Notes (Signed)
Patient ID: Troy Harrison, male    DOB: 1960-12-01  MRN: [id number]  CC: chronic ds management  Subjective: Troy Harrison is a 60 y.o. 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 Pain unchanged from when he saw urologist several yrs ago.  LT testicle hangs lower than RT. Had seen urologist for same few yrs ago.  US done 10/2016 revealed small left varicocele.  C/o dry mouth especially during the night.  Started after he had cystoscopy in 2019 by urology Reported same in 03/2018.  I had done Sjogren's syndrome work up which was negative.  I recommended he tried the over-the-counter mouthwash called Biotene but he never did.  C/o having cramps in legs. Occurs only at nights after he comes home from working. Better when his wife massages legs.  No cramps in the legs when he is up and active.  Some cramps in hands as well.   Patient Active Problem List   Diagnosis Date Noted   Bilateral inguinal hernia without obstruction or gangrene 02/01/2020   Nocturia 11/07/2019   Weight loss, unintentional 08/25/2017   Deformity of left hand 03/08/2017   Chronic midline thoracic back pain 03/08/2017   Benign prostatic hyperplasia with urinary frequency 12/06/2016   Varicocele present on ultrasound of scrotum 12/06/2016   Vitamin D deficiency 12/06/2016   HTN (hypertension), benign 11/08/2016   Constipation 12/17/2013   Hemorrhoids, external without complications 12/17/2013   Special screening for malignant neoplasms, colon 12/17/2013     Current Outpatient Medications on File Prior to Visit  Medication Sig Dispense Refill   acetaminophen (TYLENOL) 500 MG tablet Take 500 mg by mouth every 6 (six) hours as needed.     No current  facility-administered medications on file prior to visit.    Allergies  Allergen Reactions   Bactrim [Sulfamethoxazole-Trimethoprim] Itching    Social History   Socioeconomic History   Marital status: Married    Spouse name: Not on file   Number of children: Not on file   Years of education: Not on file   Highest education level: Not on file  Occupational History   Not on file  Tobacco Use   Smoking status: Former    Pack years: 0.00    Types: Cigarettes    Quit date: 04/26/1989    Years since quitting: 31.6   Smokeless tobacco: Never  Vaping Use   Vaping Use: Never used  Substance and Sexual Activity   Alcohol use: No   Drug use: No   Sexual activity: Yes  Other Topics Concern   Not on file  Social History Narrative   Not on file   Social Determinants of Health   Financial Resource Strain: Not on file  Food Insecurity: Not on file  Transportation Needs: Not on file  Physical Activity: Not on file  Stress: Not on file  Social Connections: Not on file  Intimate Partner Violence: Not on file    Family History  Problem Relation Age of Onset   Colon cancer Neg Hx     Past Surgical History:  Procedure Laterality Date   COLONOSCOPY N/A 04/26/2016   Procedure: COLONOSCOPY;  Surgeon: West Bali, MD;  Location: [facility];  Service: Endoscopy;  Laterality: N/A;  1200   CYSTOSCOPY WITH INSERTION OF UROLIFT N/A 01/02/2018   Procedure: CYSTOSCOPY WITH INSERTION OF UROLIFT;  Surgeon: Malen Gauze, MD;  Location: [facility];  Service: Urology;  Laterality: N/A;    ROS: Review of Systems Negative except as stated above  PHYSICAL EXAM: BP 113/70   Pulse 73   Temp 98.3 F (36.8 C)   Resp 16   Wt 156 lb (70.8 kg)   SpO2 99%   BMI 23.04 kg/m   Wt Readings from Last 3 Encounters:  12/26/20 156 lb (70.8 kg)  02/01/20 163 lb 9.6 oz (74.2 kg)  11/07/19 162 lb 8 oz (73.7 kg)    Physical Exam  General appearance - alert, well appearing, older Hispanic  male and in no distress Mental status - normal mood, behavior, speech, dress, motor activity, and thought processes.  Patient is long-winded in his answers and often has to be redirected. Mouth: Oral mucosa is moist.  He has partials in the upper jaw.  Throat is clear without erythema or exudates. Chest - clear to auscultation, no wheezes, rales or rhonchi, symmetric air entry Heart - normal rate, regular rhythm, normal S1, S2, no murmurs, rubs, clicks or gallops Musculoskeletal - no joint tenderness, deformity or swelling Extremities -no lower extremity edema.  He has good pulses in both lower extremities. GU: RN French Ana or Sharlet Salina present: Scrotal sac appears normal.  No abnormal testicular masses palpated. CMP Latest Ref Rng & Units 07/21/2020 04/25/2019 03/08/2018  Glucose 65 - 99 mg/dL 94 83 90  BUN 8 - 27 mg/dL 11 13 15   Creatinine 0.76 - 1.27 mg/dL 6.01 0.93  Sodium 134 - 144 mmol/L 137 142 141  Potassium 3.5 - 5.2 mmol/L 4.0 4.1 4.6  Chloride 96 - 106 mmol/L 100 99 99  CO2 20 - 29 mmol/L 24 24 26   Calcium 8.6 - 10.2 mg/dL 8.7 9.3 9.3  Total Protein 6.0 - 8.5 g/dL 6.8 7.2 6.6  Total Bilirubin 0.0 - 1.2 mg/dL 0.2 0.2 0.4  Alkaline Phos 44 - 121 IU/L 62 85 66  AST 0 - 40 IU/L 23 30 24   ALT 0 - 44 IU/L 19 26 25    Lipid Panel     Component Value Date/Time   CHOL 201 (H) 07/21/2020 0830   TRIG 92 07/21/2020 0830   HDL 77 07/21/2020 0830   CHOLHDL 2.6 07/21/2020 0830   CHOLHDL 2.1 03/14/2014 1220   VLDL 17 03/14/2014 1220   LDLCALC 108 (H) 07/21/2020 0830    CBC    Component Value Date/Time   WBC 3.9 07/21/2020 0830   WBC 5.0 12/28/2017 1230   RBC 5.37 07/21/2020 0830   RBC 4.87 12/28/2017 1230   HGB 16.0 07/21/2020 0830   HCT 46.9 07/21/2020 0830   PLT 218 07/21/2020 0830   MCV 87 07/21/2020 0830   MCH 29.8 07/21/2020 0830   MCH 30.6 12/28/2017 1230   MCHC 34.1 07/21/2020 0830   MCHC 33.6 12/28/2017 1230   RDW 12.3 07/21/2020 0830   LYMPHSABS 2.1 04/25/2019 1429    MONOABS 0.4 12/11/2013 1546   EOSABS 0.1 04/25/2019 1429   BASOSABS 0.0 04/25/2019 1429    ASSESSMENT AND PLAN: 1. Mouth dryness I recommend that he try the Biotene mouthwash which can be purchased over-the-counter.  2. Testicular pain, left - Ambulatory referral to Urology  3. Muscle cramp, nocturnal I will try him with low-dose of Robaxin.  Advised patient that the medication can cause drowsiness.  He should not  take the medication when he has to drive or operate any machinery. - methocarbamol (ROBAXIN) 500 MG tablet; Take 1 tablet (500 mg total) by mouth at bedtime as needed for muscle spasms.  Dispense: 30 tablet; Refill: 0   Patient was given the opportunity to ask questions.  Patient verbalized understanding of the plan and was able to repeat key elements of the plan.  [employer] Language interpreter used during this encounter. #[id number] Serene. Rosey Bath [id number].   Orders Placed This Encounter  Procedures   Ambulatory referral to Urology     Requested Prescriptions   Signed Prescriptions Disp Refills   methocarbamol (ROBAXIN) 500 MG tablet 30 tablet 0    Sig: Take 1 tablet (500 mg total) by mouth at bedtime as needed for muscle spasms.    Return in about 6 months (around 06/28/2021).  Jonah Blue, MD, FACP

## 2021-01-05 IMAGING — DX LEFT RIBS AND CHEST - 3+ VIEW
5 series · 5 of 5 positions shown · non-contrast
Comparison: 01/12/2016

CLINICAL DATA: Fall with left-sided chest pain

EXAM:
LEFT RIBS AND CHEST - 3+ VIEW

[chest pa]
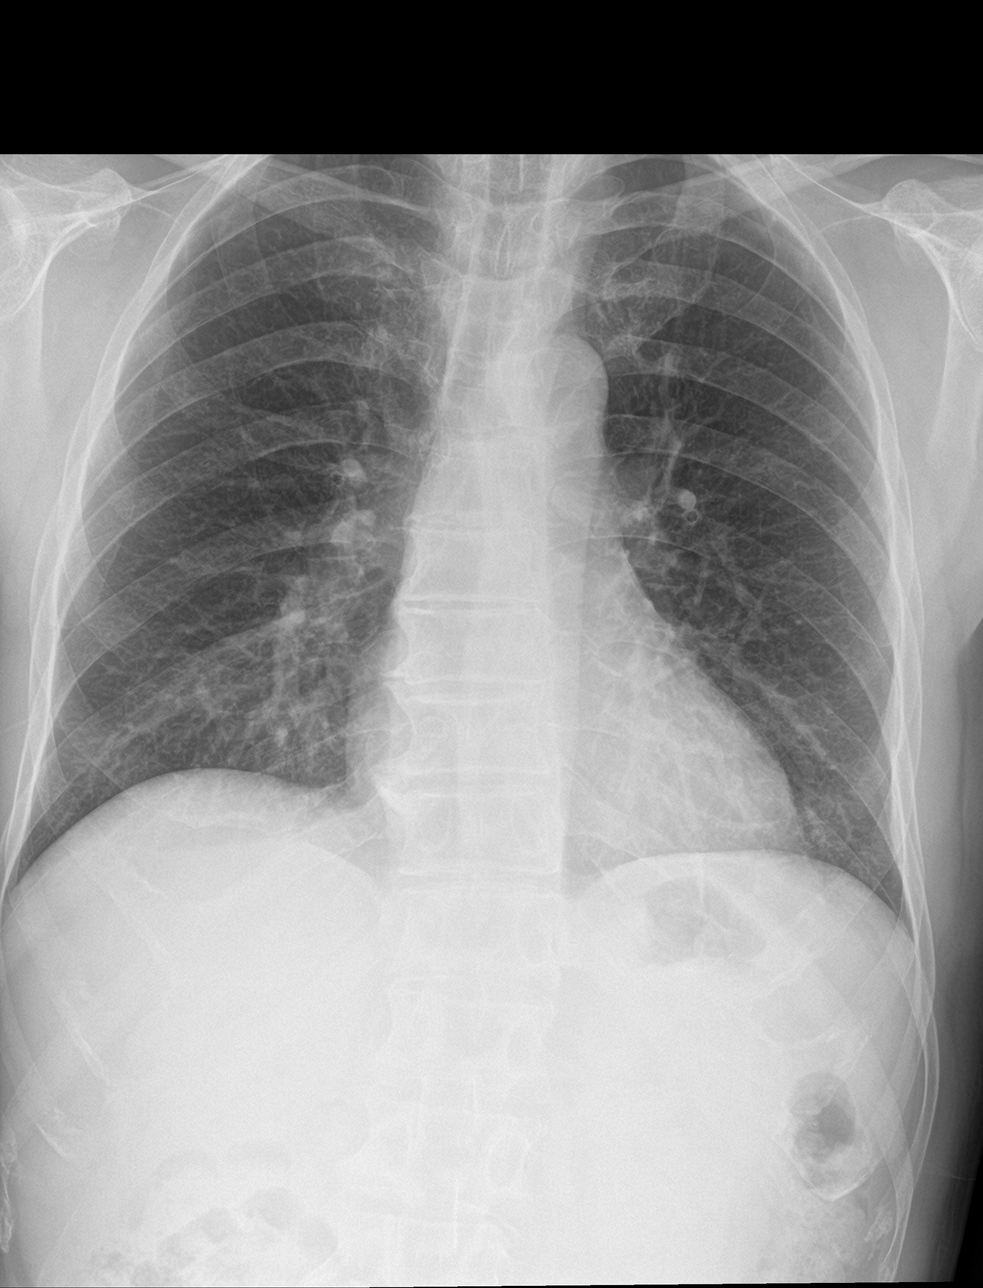

[rib obl (1 of 2)]
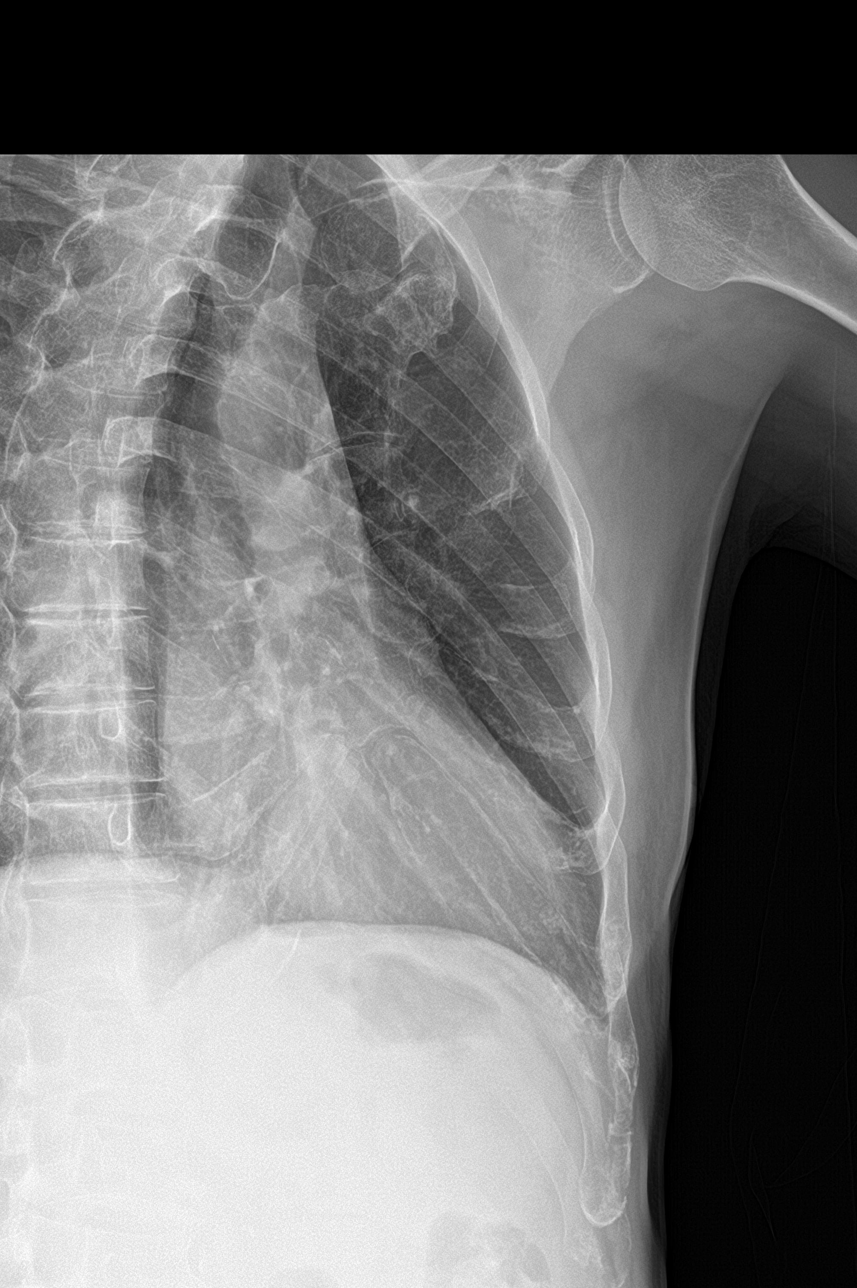

[rib obl (2 of 2)]
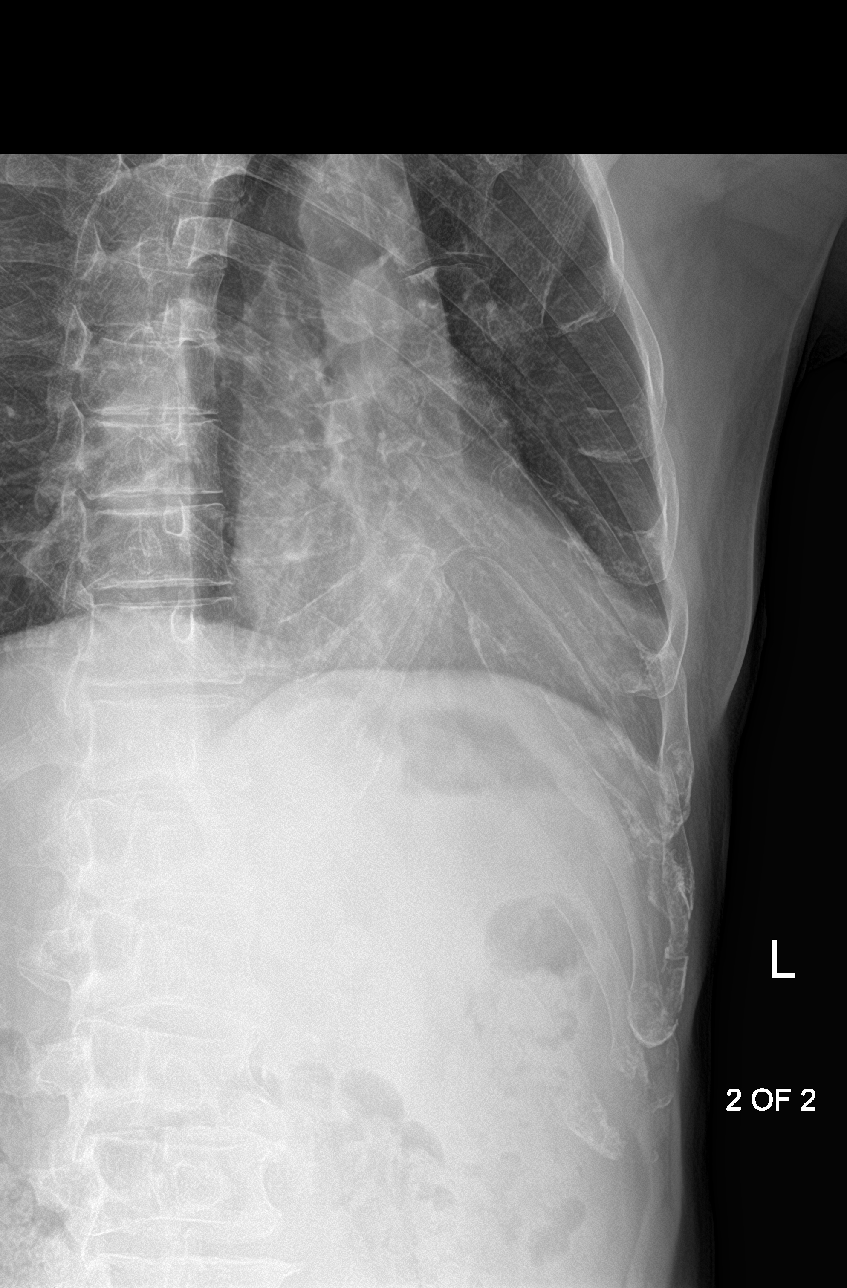

[rib pa (1 of 2)]
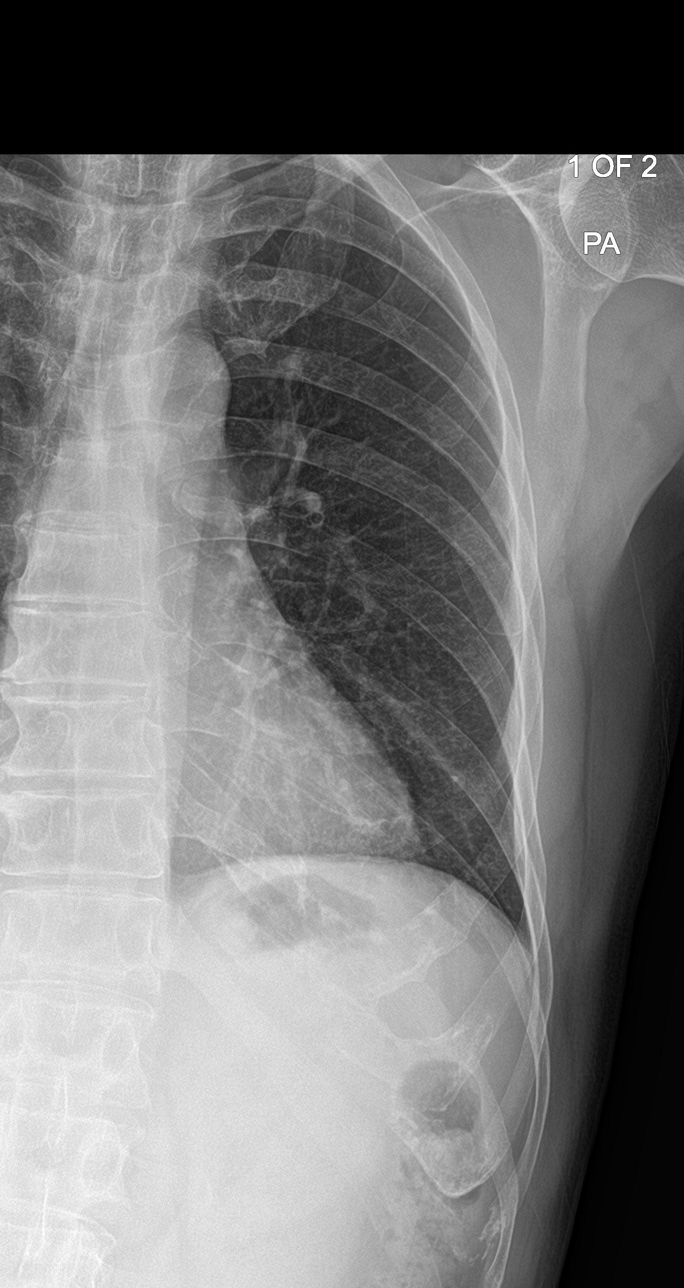

[rib pa (2 of 2)]
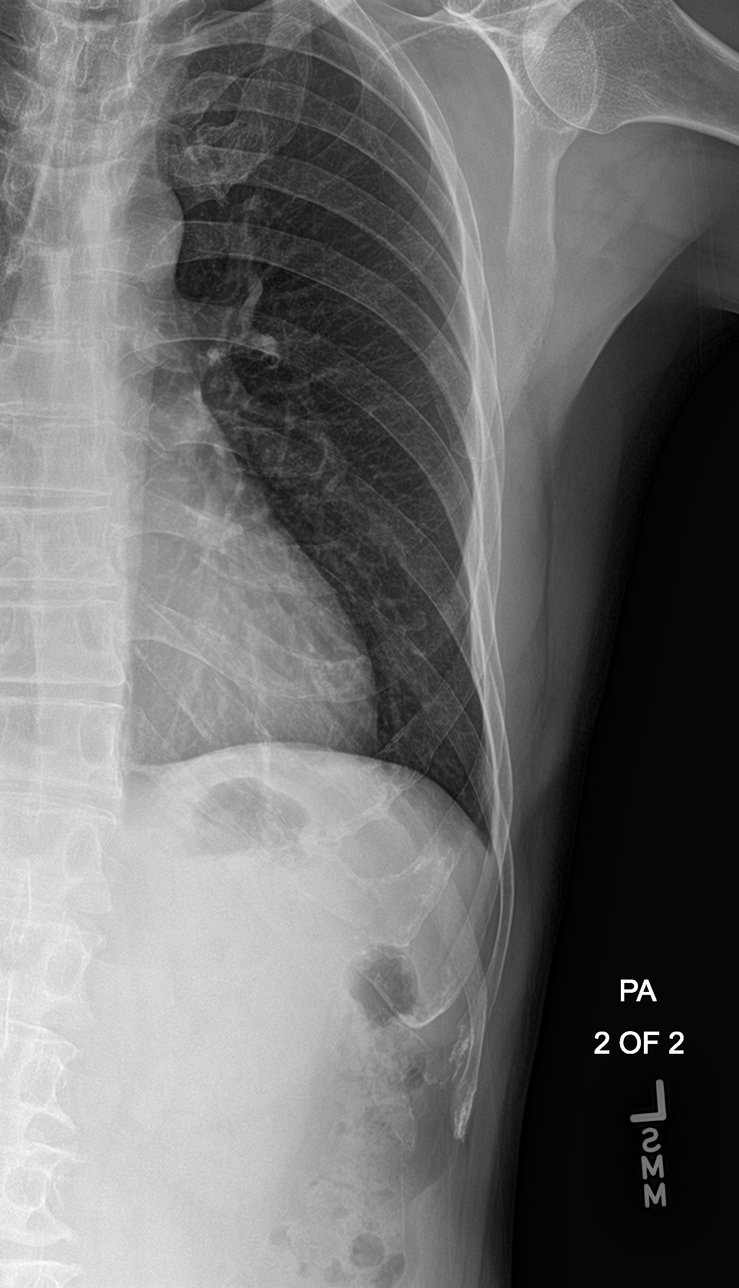

[5 of 5 positions shown; findings below may reference images not displayed]

FINDINGS: Heart size and vascularity normal. Lungs are clear without
infiltrate effusion or pneumothorax

Nondisplaced fracture left fourth and fifth ribs anteriorly.
IMPRESSION: Fractures left fourth and fifth ribs anteriorly.  Lungs are clear.

## 2021-01-27 ENCOUNTER — Ambulatory Visit: Payer: Self-pay | Attending: Internal Medicine | Admitting: Pharmacist

## 2021-01-27 ENCOUNTER — Other Ambulatory Visit: Payer: Self-pay

## 2021-01-27 DIAGNOSIS — Z23 Encounter for immunization: Secondary | ICD-10-CM

## 2021-01-27 NOTE — Progress Notes (Signed)
Patient presents for vaccination against zoster per orders of Dr. Laural Benes. Consent given. Counseling provided. No contraindications exists. Vaccine administered without incident.   Will coordinate with pharmacy for patient pharmacy.   Butch Penny, PharmD, [place]y [place], CPP Clinical Pharmacist [facility] [phone]

## 2021-03-25 NOTE — Progress Notes (Signed)
Open in error

## 2021-04-03 ENCOUNTER — Ambulatory Visit: Payer: Self-pay | Attending: Internal Medicine | Admitting: Pharmacist

## 2021-04-03 ENCOUNTER — Other Ambulatory Visit: Payer: Self-pay

## 2021-04-03 DIAGNOSIS — Z23 Encounter for immunization: Secondary | ICD-10-CM

## 2021-04-03 NOTE — Progress Notes (Signed)
Patient presents for vaccination against Shingrix per orders of Dr. Laural Benes. Consent given. Counseling provided. No contraindications exists. Vaccine administered without incident.   Butch Penny, PharmD, [place]y [place], CPP Clinical Pharmacist [facility] [phone]

## 2021-05-27 ENCOUNTER — Other Ambulatory Visit: Payer: Self-pay

## 2021-05-27 ENCOUNTER — Ambulatory Visit: Payer: Self-pay | Attending: Internal Medicine

## 2021-06-26 ENCOUNTER — Other Ambulatory Visit (HOSPITAL_COMMUNITY): Payer: Self-pay

## 2021-06-26 ENCOUNTER — Ambulatory Visit: Payer: Self-pay | Attending: Internal Medicine | Admitting: Internal Medicine

## 2021-06-26 ENCOUNTER — Encounter: Payer: Self-pay | Admitting: Internal Medicine

## 2021-06-26 ENCOUNTER — Other Ambulatory Visit: Payer: Self-pay

## 2021-06-26 VITALS — BP 129/68 | HR 76 | Resp 16 | Wt 161.6 lb

## 2021-06-26 DIAGNOSIS — R5383 Other fatigue: Secondary | ICD-10-CM

## 2021-06-26 DIAGNOSIS — R110 Nausea: Secondary | ICD-10-CM

## 2021-06-26 DIAGNOSIS — Z23 Encounter for immunization: Secondary | ICD-10-CM

## 2021-06-26 DIAGNOSIS — Z125 Encounter for screening for malignant neoplasm of prostate: Secondary | ICD-10-CM

## 2021-06-26 DIAGNOSIS — E782 Mixed hyperlipidemia: Secondary | ICD-10-CM

## 2021-06-26 MED ORDER — ONDANSETRON HCL 4 MG PO TABS
4.0000 mg | ORAL_TABLET | Freq: Every day | ORAL | 0 refills | Status: DC | PRN
  Filled 2021-06-26: qty 10, 10d supply, fill #0

## 2021-06-26 NOTE — Progress Notes (Signed)
Patient ID: Troy Harrison, male    DOB: 08/06/1960  MRN: [id number]  CC: routine f/u   Subjective: Troy Harrison is a 61 y.o. male who presents for chronic ds management His concerns today include:  history of HTN (control of med), HL, chronic midline thoracic back pain (OA and scoliosis), vitamin D deficiency, BPH and varicocele  C/o not feeling well over past few days.  Feeling "tired and lazy." Little lightheaded when he gets out of bed. Denies fever, cough, congestion, diarrhea but stools soft. Reports some mild nausea. Reports good appetite. Little pain from inguinal hernia. No recent sick contacts.  Told by people his hands are always cold.   He is agreeable to having baseline blood test done today.  He is due for cholesterol check, CBC and chemistry. Patient Active Problem List   Diagnosis Date Noted   Bilateral inguinal hernia without obstruction or gangrene 02/01/2020   Nocturia 11/07/2019   Weight loss, unintentional 08/25/2017   Deformity of left hand 03/08/2017   Chronic midline thoracic back pain 03/08/2017   Benign prostatic hyperplasia with urinary frequency 12/06/2016   Varicocele present on ultrasound of scrotum 12/06/2016   Vitamin D deficiency 12/06/2016   HTN (hypertension), benign 11/08/2016   Constipation 12/17/2013   Hemorrhoids, external without complications 12/17/2013   Special screening for malignant neoplasms, colon 12/17/2013     Current Outpatient Medications on File Prior to Visit  Medication Sig Dispense Refill   acetaminophen (TYLENOL) 500 MG tablet Take 500 mg by mouth every 6 (six) hours as needed.     methocarbamol (ROBAXIN) 500 MG tablet Take 1 tablet (500 mg total) by mouth at bedtime as needed for muscle spasms. 30 tablet 0   No current facility-administered medications on file prior to visit.    Allergies  Allergen Reactions   Bactrim [Sulfamethoxazole-Trimethoprim] Itching    Social History   Socioeconomic  History   Marital status: Married    Spouse name: Not on file   Number of children: Not on file   Years of education: Not on file   Highest education level: Not on file  Occupational History   Not on file  Tobacco Use   Smoking status: Former    Types: Cigarettes    Quit date: 04/26/1989    Years since quitting: 32.1   Smokeless tobacco: Never  Vaping Use   Vaping Use: Never used  Substance and Sexual Activity   Alcohol use: No   Drug use: No   Sexual activity: Yes  Other Topics Concern   Not on file  Social History Narrative   Not on file   Social Determinants of Health   Financial Resource Strain: Not on file  Food Insecurity: Not on file  Transportation Needs: Not on file  Physical Activity: Not on file  Stress: Not on file  Social Connections: Not on file  Intimate Partner Violence: Not on file    Family History  Problem Relation Age of Onset   Colon cancer Neg Hx     Past Surgical History:  Procedure Laterality Date   COLONOSCOPY N/A 04/26/2016   Procedure: COLONOSCOPY;  Surgeon: West Bali, MD;  Location: [facility];  Service: Endoscopy;  Laterality: N/A;  1200   CYSTOSCOPY WITH INSERTION OF UROLIFT N/A 01/02/2018   Procedure: CYSTOSCOPY WITH INSERTION OF UROLIFT;  Surgeon: Malen Gauze, MD;  Location: [facility];  Service: Urology;  Laterality: N/A;    ROS: Review of Systems Negative except as stated  above  PHYSICAL EXAM: BP 129/68    Pulse 76    Resp 16    Wt 161 lb 9.6 oz (73.3 kg)    SpO2 100%    BMI 23.86 kg/m   Wt Readings from Last 3 Encounters:  06/26/21 161 lb 9.6 oz (73.3 kg)  12/26/20 156 lb (70.8 kg)  02/01/20 163 lb 9.6 oz (74.2 kg)  Blood pressure sitting 119/71, pulse 73 Blood pressure standing 116/72, pulse of 76  Physical Exam  General appearance - alert, well appearing, elderly Hispanic male and in no distress.  Patient is a poor historian and has to be redirected to answer questions Mental status - alert, oriented to  person, place, and time, normal mood, behavior, speech, dress, motor activity, and thought processes Eyes - pupils equal and reactive, extraocular eye movements intact Mouth - mucous membranes moist, pharynx normal without lesions Neck - supple, no significant adenopathy Chest - clear to auscultation, no wheezes, rales or rhonchi, symmetric air entry Heart - normal rate, regular rhythm, normal S1, S2, no murmurs, rubs, clicks or gallops Abdomen - soft, nontender, nondistended, no masses or organomegaly GU: No inguinal hernias appreciated on exam Extremities - peripheral pulses normal, no pedal edema, no clubbing or cyanosis   CMP Latest Ref Rng & Units 07/21/2020 04/25/2019 03/08/2018  Glucose 65 - 99 mg/dL 94 83 90  BUN 8 - 27 mg/dL 11 13 15   Creatinine 0.76 - 1.27 mg/dL 4.090.95 8.110.86 9.140.85  Sodium 134 - 144 mmol/L 137 142 141  Potassium 3.5 - 5.2 mmol/L 4.0 4.1 4.6  Chloride 96 - 106 mmol/L 100 99 99  CO2 20 - 29 mmol/L 24 24 26   Calcium 8.6 - 10.2 mg/dL 8.7 9.3 9.3  Total Protein 6.0 - 8.5 g/dL 6.8 7.2 6.6  Total Bilirubin 0.0 - 1.2 mg/dL 0.2 0.2 0.4  Alkaline Phos 44 - 121 IU/L 62 85 66  AST 0 - 40 IU/L 23 30 24   ALT 0 - 44 IU/L 19 26 25    Lipid Panel     Component Value Date/Time   CHOL 201 (H) 07/21/2020 0830   TRIG 92 07/21/2020 0830   HDL 77 07/21/2020 0830   CHOLHDL 2.6 07/21/2020 0830   CHOLHDL 2.1 03/14/2014 1220   VLDL 17 03/14/2014 1220   LDLCALC 108 (H) 07/21/2020 0830    CBC    Component Value Date/Time   WBC 3.9 07/21/2020 0830   WBC 5.0 12/28/2017 1230   RBC 5.37 07/21/2020 0830   RBC 4.87 12/28/2017 1230   HGB 16.0 07/21/2020 0830   HCT 46.9 07/21/2020 0830   PLT 218 07/21/2020 0830   MCV 87 07/21/2020 0830   MCH 29.8 07/21/2020 0830   MCH 30.6 12/28/2017 1230   MCHC 34.1 07/21/2020 0830   MCHC 33.6 12/28/2017 1230   RDW 12.3 07/21/2020 0830   LYMPHSABS 2.1 04/25/2019 1429   MONOABS 0.4 12/11/2013 1546   EOSABS 0.1 04/25/2019 1429   BASOSABS 0.0  04/25/2019 1429    ASSESSMENT AND PLAN: 1. Other fatigue -Etiology unclear.  He does not have symptoms of acute viral illness and exam is unrevealing.  We will do some baseline blood test today. - CBC - Comprehensive metabolic panel  2. Nausea - Lipase - ondansetron (ZOFRAN) 4 MG tablet; Take 1 tablet (4 mg total) by mouth daily as needed for nausea or vomiting.  Dispense: 10 tablet; Refill: 0  3. Prostate cancer screening Patient agreeable to baseline blood test including PSA for prostate  cancer screening. - PSA  4. Need for immunization against influenza - Flu Vaccine QUAD 57mo+IM (Fluarix, Fluzone & Alfiuria Quad PF)  5. Mixed hyperlipidemia - Lipid panel    [employer] Language interpreter used during this encounter. #[id number] Bound  Patient was given the opportunity to ask questions.  Patient verbalized understanding of the plan and was able to repeat key elements of the plan.   Orders Placed This Encounter  Procedures   Flu Vaccine QUAD 76mo+IM (Fluarix, Fluzone & Alfiuria Quad PF)     Requested Prescriptions    No prescriptions requested or ordered in this encounter    No follow-ups on file.  Jonah Blue, MD, FACP

## 2021-06-27 LAB — CBC
Hematocrit: 45.4 % (ref 37.5–51.0)
Hemoglobin: 15.6 g/dL (ref 13.0–17.7)
MCH: 31 pg (ref 26.6–33.0)
MCHC: 34.4 g/dL (ref 31.5–35.7)
MCV: 90 fL (ref 79–97)
Platelets: 227 10*3/uL (ref 150–450)
RBC: 5.03 x10E6/uL (ref 4.14–5.80)
RDW: 12.7 % (ref 11.6–15.4)
WBC: 4.7 10*3/uL (ref 3.4–10.8)

## 2021-06-27 LAB — COMPREHENSIVE METABOLIC PANEL
ALT: 22 IU/L (ref 0–44)
AST: 26 IU/L (ref 0–40)
Albumin/Globulin Ratio: 2 (ref 1.2–2.2)
Albumin: 4.3 g/dL (ref 3.8–4.8)
Alkaline Phosphatase: 61 IU/L (ref 44–121)
BUN/Creatinine Ratio: 21 (ref 10–24)
BUN: 16 mg/dL (ref 8–27)
Bilirubin Total: 0.3 mg/dL (ref 0.0–1.2)
CO2: 26 mmol/L (ref 20–29)
Calcium: 9 mg/dL (ref 8.6–10.2)
Chloride: 100 mmol/L (ref 96–106)
Creatinine, Ser: 0.78 mg/dL (ref 0.76–1.27)
Globulin, Total: 2.1 g/dL (ref 1.5–4.5)
Glucose: 84 mg/dL (ref 70–99)
Potassium: 4.3 mmol/L (ref 3.5–5.2)
Sodium: 139 mmol/L (ref 134–144)
Total Protein: 6.4 g/dL (ref 6.0–8.5)
eGFR: 101 mL/min/{1.73_m2} (ref >59)

## 2021-06-27 LAB — LIPID PANEL
Chol/HDL Ratio: 2.3 ratio (ref 0.0–5.0)
Cholesterol, Total: 208 mg/dL — ABNORMAL HIGH (ref 100–199)
HDL: 90 mg/dL (ref >39)
LDL Chol Calc (NIH): 105 mg/dL — ABNORMAL HIGH (ref 0–99)
Triglycerides: 72 mg/dL (ref 0–149)
VLDL Cholesterol Cal: 13 mg/dL (ref 5–40)

## 2021-06-27 LAB — LIPASE: Lipase: 34 U/L (ref 13–78)

## 2021-06-27 LAB — PSA: Prostate Specific Ag, Serum: 1 ng/mL (ref 0.0–4.0)

## 2021-06-30 ENCOUNTER — Telehealth: Payer: Self-pay

## 2021-06-30 NOTE — Telephone Encounter (Signed)
Contacted pt to go over lab results pt didn't answer lvm  Troy Harrison contacted the pt with me and lvm    Sent a CRM and forward labs to NT to give pt labs when they call back

## 2021-07-01 ENCOUNTER — Telehealth: Payer: Self-pay | Admitting: *Deleted

## 2021-07-01 NOTE — Telephone Encounter (Signed)
Patient called for lab results- patient was given lab results and provider recommendations. Patient states he did want to let provider know that he was given Rx and has had hives with itching since starting. Patient is at work and does not know the name of the medication. Patient advised to please call back with drug name so we can further assist him with his symptoms and reaction to medication- may want to hold dosing. Patient will call back with the requested information.

## 2022-03-02 ENCOUNTER — Ambulatory Visit: Payer: Self-pay

## 2022-03-02 NOTE — Telephone Encounter (Signed)
  Chief Complaint: severe toothache upper left side Symptoms: facial swelling, bleeding,severe pain, fever Frequency: 5 days Pertinent Negatives: Patient denies n/a Disposition: [x] ED /[] Urgent Care (no appt availability in office) / [] Appointment(In office/virtual)/ []  [facility]/ [] Home Care/ [] Refused Recommended Disposition /[] [facility]/ []  Follow-up with PCP Additional Notes: assisted by Spanish interpreter [phone] Reason for Disposition  [1] Face is swollen AND [2] fever  Answer Assessment - Initial Assessment Questions 1. LOCATION: "Which tooth is hurting?"  (e.g., right-side/left-side, upper/lower, front/back)     Upper left side 2. ONSET: "When did the toothache start?"  (e.g., hours, days)      5 days 3. SEVERITY: "How bad is the toothache?"  (Scale 1-10; mild, moderate or severe)   - MILD (1-3): doesn't interfere with chewing    - MODERATE (4-7): interferes with chewing, interferes with normal activities, awakens from sleep     - SEVERE (8-10): unable to eat, unable to do any normal activities, excruciating pain        Night severe pain 4. SWELLING: "Is there any visible swelling of your face?"     yes 5. OTHER SYMPTOMS: "Do you have any other symptoms?" (e.g., fever)     Bleeding started Sunday night Monday am , fever 6. PREGNANCY: "Is there any chance you are pregnant?" "When was your last menstrual period?"     N/a  Protocols used: Toothache-A-AH

## 2022-03-02 NOTE — Telephone Encounter (Signed)
FYI

## 2022-03-03 NOTE — Telephone Encounter (Signed)
Thanks, noted. Sent to pcp via FYI.---------DD,RMA

## 2022-03-26 ENCOUNTER — Encounter: Payer: Self-pay | Admitting: Internal Medicine

## 2022-03-26 ENCOUNTER — Ambulatory Visit: Payer: Self-pay | Attending: Internal Medicine | Admitting: Internal Medicine

## 2022-03-26 VITALS — BP 120/70 | HR 69 | Ht 69.0 in | Wt 159.0 lb

## 2022-03-26 DIAGNOSIS — Z0000 Encounter for general adult medical examination without abnormal findings: Secondary | ICD-10-CM

## 2022-03-26 DIAGNOSIS — Z23 Encounter for immunization: Secondary | ICD-10-CM

## 2022-03-26 NOTE — Patient Instructions (Signed)
Mantenimiento de la salud en los hombres Health Maintenance, Male Adoptar un estilo de vida saludable y recibir atencin preventiva son importantes para promover la salud y el bienestar. Consulte al mdico sobre: El esquema adecuado para hacerse pruebas y exmenes peridicos. Cosas que puede hacer por su cuenta para prevenir enfermedades y mantenerse sano. Qu debo saber sobre la dieta, el peso y el ejercicio? Consuma una dieta saludable  Consuma una dieta que incluya muchas verduras, frutas, productos lcteos con bajo contenido de grasa y protenas magras. No consuma muchos alimentos ricos en grasas slidas, azcares agregados o sodio. Mantenga un peso saludable El ndice de masa muscular (IMC) es una medida que puede utilizarse para identificar posibles problemas de peso. Proporciona una estimacin de la grasa corporal basndose en el peso y la altura. Su mdico puede ayudarle a determinar su IMC y a lograr o mantener un peso saludable. Haga ejercicio con regularidad Haga ejercicio con regularidad. Esta es una de las prcticas ms importantes que puede hacer por su salud. La mayora de los adultos deben seguir estas pautas: Realizar, al menos, 150 minutos de actividad fsica por semana. El ejercicio debe aumentar la frecuencia cardaca y hacerlo transpirar (ejercicio de intensidad moderada). Hacer ejercicios de fortalecimiento por lo menos dos veces por semana. Agregue esto a su plan de ejercicio de intensidad moderada. Pase menos tiempo sentado. Incluso la actividad fsica ligera puede ser beneficiosa. Controle sus niveles de colesterol y lpidos en la sangre Comience a realizarse anlisis de lpidos y colesterol en la sangre a los 20 aos y luego reptalos cada 5 aos. Es posible que necesite controlar los niveles de colesterol con mayor frecuencia si: Sus niveles de lpidos y colesterol son altos. Es mayor de 40 aos. Presenta un alto riesgo de padecer enfermedades cardacas. Qu debo  saber sobre las pruebas de deteccin del cncer? Muchos tipos de cncer pueden detectarse de manera temprana y, a menudo, pueden prevenirse. Segn su historia clnica y sus antecedentes familiares, es posible que deba realizarse pruebas de deteccin del cncer en diferentes edades. Esto puede incluir pruebas de deteccin de lo siguiente: Cncer colorrectal. Cncer de prstata. Cncer de piel. Cncer de pulmn. Qu debo saber sobre la enfermedad cardaca, la diabetes y la hipertensin arterial? Presin arterial y enfermedad cardaca La hipertensin arterial causa enfermedades cardacas y aumenta el riesgo de accidente cerebrovascular. Es ms probable que esto se manifieste en las personas que tienen lecturas de presin arterial alta o tienen sobrepeso. Hable con el mdico sobre sus valores de presin arterial deseados. Hgase controlar la presin arterial: Cada 3 a 5 aos si tiene entre 18 y 39 aos. Todos los aos si es mayor de 40 aos. Si tiene entre 65 y 75 aos y es fumador o sola fumar, pregntele al mdico si debe realizarse una prueba de deteccin de aneurisma artico abdominal (AAA) por nica vez. Diabetes Realcese exmenes de deteccin de la diabetes con regularidad. Este anlisis revisa el nivel de azcar en la sangre en ayunas. Hgase las pruebas de deteccin: Cada tres aos despus de los 45 aos de edad si tiene un peso normal y un bajo riesgo de padecer diabetes. Con ms frecuencia y a partir de una edad inferior si tiene sobrepeso o un alto riesgo de padecer diabetes. Qu debo saber sobre la prevencin de infecciones? Hepatitis B Si tiene un riesgo ms alto de contraer hepatitis B, debe someterse a un examen de deteccin de este virus. Hable con el mdico para averiguar si tiene riesgo de   contraer la infeccin por hepatitis B. Hepatitis C Se recomienda un anlisis de sangre para: Todos los que nacieron entre 1945 y 1965. Todas las personas que tengan un riesgo de haber  contrado hepatitis C. Enfermedades de transmisin sexual (ETS) Debe realizarse pruebas de deteccin de ITS todos los aos, incluidas la gonorrea y la clamidia, si: Es sexualmente activo y es menor de 24 aos. Es mayor de 24 aos, y el mdico le informa que corre riesgo de tener este tipo de infecciones. La actividad sexual ha cambiado desde que le hicieron la ltima prueba de deteccin y tiene un riesgo mayor de tener clamidia o gonorrea. Pregntele al mdico si usted tiene riesgo. Pregntele al mdico si usted tiene un alto riesgo de contraer VIH. El mdico tambin puede recomendarle un medicamento recetado para ayudar a evitar la infeccin por el VIH. Si elige tomar medicamentos para prevenir el VIH, primero debe hacerse los anlisis de deteccin del VIH. Luego debe hacerse anlisis cada 3 meses mientras est tomando los medicamentos. Siga estas indicaciones en su casa: Consumo de alcohol No beba alcohol si el mdico se lo prohbe. Si bebe alcohol: Limite la cantidad que consume de 0 a 2 bebidas por da. Sepa cunta cantidad de alcohol hay en las bebidas que toma. En [place], una medida equivale a una botella de cerveza de 12 oz (355 ml), un vaso de vino de 5 oz (148 ml) o un vaso de una bebida alcohlica de alta graduacin de 1 oz (44 ml). Estilo de vida No consuma ningn producto que contenga nicotina o tabaco. Estos productos incluyen cigarrillos, tabaco para mascar y aparatos de vapeo, como los cigarrillos electrnicos. Si necesita ayuda para dejar de consumir estos productos, consulte al mdico. No consuma drogas. No comparta agujas. Solicite ayuda a su mdico si necesita apoyo o informacin para abandonar las drogas. Indicaciones generales Realcese los estudios de rutina de la salud, dentales y de la vista. Mantngase al da con las vacunas. Infrmele a su mdico si: Se siente deprimido con frecuencia. Alguna vez ha sido vctima de maltrato o no se siente seguro en su  casa. Resumen Adoptar un estilo de vida saludable y recibir atencin preventiva son importantes para promover la salud y el bienestar. Siga las instrucciones del mdico acerca de una dieta saludable, el ejercicio y la realizacin de pruebas o exmenes para detectar enfermedades. Siga las instrucciones del mdico con respecto al control del colesterol y la presin arterial. Esta informacin no tiene como fin reemplazar el consejo del mdico. Asegrese de hacerle al mdico cualquier pregunta que tenga. Document Revised: 11/12/2020 Document Reviewed: 11/12/2020 Elsevier Patient Education  2023 Elsevier Inc.  

## 2022-03-26 NOTE — Progress Notes (Signed)
Patient ID: Troy Harrison, male    DOB: 12-Aug-1960  MRN: [id number]  CC: Annual Exam   Subjective: Troy Harrison is a 61 y.o. male who presents for annual exam His concerns today include:  history of HTN (control of med), HL, chronic midline thoracic back pain (OA and scoliosis), vitamin D deficiency, BPH and varicocele  Pt here for physical.  Requesting urine test as well; "just wants to make sure all is well with me."  No issues with dysuria or hematuria.  Passing urine ok.  Exercises on TM and bicycle 30 mins daily. Does well with eating habits   BP elev today. Checks BP at [facility] intermittently.  Last checked 3 wks ago SBP was 137.  He does not recall DBP  HM:  needs flu shot and COVID 19 booster.  Thinks he had 3 doses of COVID vaccine with last one being 1 yr ago.   Patient Active Problem List   Diagnosis Date Noted   Bilateral inguinal hernia without obstruction or gangrene 02/01/2020   Nocturia 11/07/2019   Weight loss, unintentional 08/25/2017   Deformity of left hand 03/08/2017   Chronic midline thoracic back pain 03/08/2017   Benign prostatic hyperplasia with urinary frequency 12/06/2016   Varicocele present on ultrasound of scrotum 12/06/2016   Vitamin D deficiency 12/06/2016   HTN (hypertension), benign 11/08/2016   Constipation 12/17/2013   Hemorrhoids, external without complications 12/17/2013   Special screening for malignant neoplasms, colon 12/17/2013     Current Outpatient Medications on File Prior to Visit  Medication Sig Dispense Refill   acetaminophen (TYLENOL) 500 MG tablet Take 500 mg by mouth every 6 (six) hours as needed.     methocarbamol (ROBAXIN) 500 MG tablet Take 1 tablet (500 mg total) by mouth at bedtime as needed for muscle spasms. 30 tablet 0   ondansetron (ZOFRAN) 4 MG tablet Take 1 tablet (4 mg total) by mouth daily as needed for nausea or vomiting. 10 tablet 0   No current facility-administered medications on file  prior to visit.    Allergies  Allergen Reactions   Bactrim [Sulfamethoxazole-Trimethoprim] Itching    Social History   Socioeconomic History   Marital status: Married    Spouse name: Not on file   Number of children: Not on file   Years of education: Not on file   Highest education level: Not on file  Occupational History   Not on file  Tobacco Use   Smoking status: Former    Types: Cigarettes    Quit date: 04/26/1989    Years since quitting: 32.9   Smokeless tobacco: Never  Vaping Use   Vaping Use: Never used  Substance and Sexual Activity   Alcohol use: No   Drug use: No   Sexual activity: Yes  Other Topics Concern   Not on file  Social History Narrative   Not on file   Social Determinants of Health   Financial Resource Strain: Not on file  Food Insecurity: Not on file  Transportation Needs: Not on file  Physical Activity: Not on file  Stress: Not on file  Social Connections: Not on file  Intimate Partner Violence: Not on file    Family History  Problem Relation Age of Onset   Colon cancer Neg Hx     Past Surgical History:  Procedure Laterality Date   COLONOSCOPY N/A 04/26/2016   Procedure: COLONOSCOPY;  Surgeon: West Bali, MD;  Location: [facility];  Service: Endoscopy;  Laterality: N/A;  1200   CYSTOSCOPY WITH INSERTION OF UROLIFT N/A 01/02/2018   Procedure: CYSTOSCOPY WITH INSERTION OF UROLIFT;  Surgeon: Malen Gauze, MD;  Location: [facility];  Service: Urology;  Laterality: N/A;    ROS: Review of Systems  Constitutional:  Negative for activity change.  HENT:  Negative for congestion, hearing loss and sore throat.        Has appointment next month for teeth cleaning and to get partial dentures.  Eyes:        He tells me that he wears reading glasses.  Also wears glasses when he drives.  Last eye exam was 3 years ago.  Plans to get an updated eye exam.  Respiratory:  Negative for cough and shortness of breath.   Cardiovascular:  Negative  for chest pain.  Gastrointestinal:  Negative for abdominal pain.       Moving bowels okay.  No blood in the stools.  Up-to-date with colon cancer screening.  Genitourinary:  Negative for difficulty urinating.  Musculoskeletal:        Denies any joint pains at this time.     PHYSICAL EXAM: BP 120/70   Pulse 69   Ht 5\' 9"  (1.753 m)   Wt 159 lb (72.1 kg)   SpO2 100%   BMI 23.48 kg/m   Wt Readings from Last 3 Encounters:  03/26/22 159 lb (72.1 kg)  06/26/21 161 lb 9.6 oz (73.3 kg)  12/26/20 156 lb (70.8 kg)  Repeat blood pressure 120/70 in both right and left upper extremity with manual check.  Physical Exam  General appearance - alert, well appearing, pleasant elderly Hispanic male and in no distress Mental status - normal mood, behavior, speech, dress, motor activity, and thought processes Eyes - pupils equal and reactive, extraocular eye movements intact Ears - bilateral TM's and external ear canals normal Nose - normal and patent, no erythema, discharge or polyps Mouth - mucous membranes moist, pharynx normal without lesions.  Crown eroded on several of his teeth.  He has partials above. Neck - supple, no significant adenopathy Lymphatics - no palpable lymphadenopathy, no hepatosplenomegaly Chest - clear to auscultation, no wheezes, rales or rhonchi, symmetric air entry Heart - normal rate, regular rhythm, normal S1, S2, no murmurs, rubs, clicks or gallops Abdomen - soft, nontender, nondistended, no masses or organomegaly Musculoskeletal -noted enlargement of the DIP and PIP joints.  Flexion deformity of the left ring finger at the PIP joint. Extremities - peripheral pulses normal, no pedal edema, no clubbing or cyanosis Skin - normal coloration and turgor, no rashes, no suspicious skin lesions noted      Latest Ref Rng & Units 06/26/2021    9:16 AM 07/21/2020    8:30 AM 04/25/2019    2:29 PM  CMP  Glucose 70 - 99 mg/dL 84  94  83   BUN 8 - 27 mg/dL 16  11  13     Creatinine 0.76 - 1.27 mg/dL 13/09/2018   3.53   Sodium 134 - 144 mmol/L 139  137  142   Potassium 3.5 - 5.2 mmol/L 4.3  4.0  4.1   Chloride 96 - 106 mmol/L 100  100  99   CO2 20 - 29 mmol/L 26  24  24    Calcium 8.6 - 10.2 mg/dL 9.0  8.7  9.3   Total Protein 6.0 - 8.5 g/dL 6.4  6.8  7.2   Total Bilirubin 0.0 - 1.2 mg/dL 0.3  0.2  0.2   Alkaline Phos  44 - 121 IU/L 61  62  85   AST 0 - 40 IU/L 26  23  30    ALT 0 - 44 IU/L 22  19  26     Lipid Panel     Component Value Date/Time   CHOL 208 (H) 06/26/2021 0916   TRIG 72 06/26/2021 0916   HDL 90 06/26/2021 0916   CHOLHDL 2.3 06/26/2021 0916   CHOLHDL 2.1 03/14/2014 1220   VLDL 17 03/14/2014 1220   LDLCALC 105 (H) 06/26/2021 0916    CBC    Component Value Date/Time   WBC 4.7 06/26/2021 0916   WBC 5.0 12/28/2017 1230   RBC 5.03 06/26/2021 0916   RBC 4.87 12/28/2017 1230   HGB 15.6 06/26/2021 0916   HCT 45.4 06/26/2021 0916   PLT 227 06/26/2021 0916   MCV 90 06/26/2021 0916   MCH 31.0 06/26/2021 0916   MCH 30.6 12/28/2017 1230   MCHC 34.4 06/26/2021 0916   MCHC 33.6 12/28/2017 1230   RDW 12.7 06/26/2021 0916   LYMPHSABS 2.1 04/25/2019 1429   MONOABS 0.4 12/11/2013 1546   EOSABS 0.1 04/25/2019 1429   BASOSABS 0.0 04/25/2019 1429    ASSESSMENT AND PLAN: 1. Annual physical exam Overall patient appears to be doing well for his age.  He does have arthritis changes in the joints of his hands but denies any arthritic pain.  Encouraged him to continue healthy eating habits and regular exercise as he has been doing. -He will make appointment with an optometrist for eye exam.  He will keep his upcoming appointment with the dentist. - CBC - Comprehensive metabolic panel - Urinalysis, Routine w reflex microscopic  2. Need for influenza vaccination Given today.  3. Need for second booster dose of COVID-19 vaccine Advised to get a COVID booster at any outside pharmacy.   [employer] Language interpreter used during this encounter.  #[id number] Boers.  Patient was given the opportunity to ask questions.  Patient verbalized understanding of the plan and was able to repeat key elements of the plan.   This documentation was completed using Radio producer.  Any transcriptional errors are unintentional.  Orders Placed This Encounter  Procedures   CBC   Comprehensive metabolic panel   Urinalysis, Routine w reflex microscopic     Requested Prescriptions    No prescriptions requested or ordered in this encounter    Return in about 6 months (around 09/25/2022).  Karle Plumber, MD, FACP

## 2022-03-27 LAB — COMPREHENSIVE METABOLIC PANEL
ALT: 20 IU/L (ref 0–44)
AST: 26 IU/L (ref 0–40)
Albumin/Globulin Ratio: 2.1 (ref 1.2–2.2)
Albumin: 4.6 g/dL (ref 3.9–4.9)
Alkaline Phosphatase: 77 IU/L (ref 44–121)
BUN/Creatinine Ratio: 18 (ref 10–24)
BUN: 14 mg/dL (ref 8–27)
Bilirubin Total: 0.3 mg/dL (ref 0.0–1.2)
CO2: 23 mmol/L (ref 20–29)
Calcium: 9.2 mg/dL (ref 8.6–10.2)
Chloride: 101 mmol/L (ref 96–106)
Creatinine, Ser: 0.76 mg/dL (ref 0.76–1.27)
Globulin, Total: 2.2 g/dL (ref 1.5–4.5)
Glucose: 87 mg/dL (ref 70–99)
Potassium: 4.7 mmol/L (ref 3.5–5.2)
Sodium: 141 mmol/L (ref 134–144)
Total Protein: 6.8 g/dL (ref 6.0–8.5)
eGFR: 102 mL/min/{1.73_m2} (ref >59)

## 2022-03-27 LAB — URINALYSIS, ROUTINE W REFLEX MICROSCOPIC
Bilirubin, UA: NEGATIVE
Glucose, UA: NEGATIVE
Ketones, UA: NEGATIVE
Leukocytes,UA: NEGATIVE
Nitrite, UA: NEGATIVE
Protein,UA: NEGATIVE
RBC, UA: NEGATIVE
Specific Gravity, UA: 1.022 (ref 1.005–1.030)
Urobilinogen, Ur: 0.2 mg/dL (ref 0.2–1.0)
pH, UA: 7 (ref 5.0–7.5)

## 2022-03-27 LAB — CBC
Hematocrit: 47.1 % (ref 37.5–51.0)
Hemoglobin: 16.6 g/dL (ref 13.0–17.7)
MCH: 31.5 pg (ref 26.6–33.0)
MCHC: 35.2 g/dL (ref 31.5–35.7)
MCV: 89 fL (ref 79–97)
Platelets: 225 10*3/uL (ref 150–450)
RBC: 5.27 x10E6/uL (ref 4.14–5.80)
RDW: 12.8 % (ref 11.6–15.4)
WBC: 5 10*3/uL (ref 3.4–10.8)

## 2022-08-23 ENCOUNTER — Ambulatory Visit (HOSPITAL_COMMUNITY)
Admission: EM | Admit: 2022-08-23 | Discharge: 2022-08-23 | Disposition: A | Discharge status: Home / Self Care | Payer: Self-pay | Attending: Family Medicine | Admitting: Family Medicine

## 2022-08-23 ENCOUNTER — Other Ambulatory Visit: Payer: Self-pay

## 2022-08-23 ENCOUNTER — Encounter (HOSPITAL_COMMUNITY): Payer: Self-pay

## 2022-08-23 DIAGNOSIS — Z87891 Personal history of nicotine dependence: Secondary | ICD-10-CM | POA: Insufficient documentation

## 2022-08-23 DIAGNOSIS — Z1152 Encounter for screening for COVID-19: Secondary | ICD-10-CM | POA: Insufficient documentation

## 2022-08-23 DIAGNOSIS — J069 Acute upper respiratory infection, unspecified: Secondary | ICD-10-CM

## 2022-08-23 LAB — SARS CORONAVIRUS 2 (TAT 6-24 HRS): SARS Coronavirus 2: NEGATIVE

## 2022-08-23 MED ORDER — BENZONATATE 100 MG PO CAPS
100.0000 mg | ORAL_CAPSULE | Freq: Three times a day (TID) | ORAL | 0 refills | Status: DC | PRN
  Filled 2022-08-23: qty 21, 7d supply, fill #0

## 2022-08-23 MED ORDER — FLUTICASONE PROPIONATE 50 MCG/ACT NA SUSP
2.0000 | Freq: Every day | NASAL | 0 refills | Status: DC
  Filled 2022-08-23: qty 16, 30d supply, fill #0

## 2022-08-23 NOTE — Discharge Instructions (Signed)
Fluticasone/Flonase nose spray--put 2 sprays in each nostril once daily(2 esprays cada lado de la nariz)  Take benzonatate 100 mg, 1 tab every 8 hours as needed for cough. (Tome 1 capsula 3 veces al dia cuando tiene tos)  You have been swabbed for COVID, and if positive staff will call you. (Hemos hecho una prueba de COVID. Si es positiva, las enfermeras le hablarian a decirle.

## 2022-08-23 NOTE — ED Provider Notes (Signed)
[place]    CSN: [id number] Arrival date & time: 08/23/22  [id number]      History   Chief Complaint Chief Complaint  Patient presents with   Cough   Sore Throat    HPI Troy Harrison is a 62 y.o. male.    Cough Sore Throat   Here for cough and congestion.  At first he says he has had some cough for about 5 days, but then he states that the symptoms began on March 1.  He had some scratchiness in his throat and cough began then.  He states he has had clear rhinorrhea for 2 weeks.  No shortness of breath and no wheezing.  No fever or chills or vomiting or diarrhea.  He does not take any medications for chronic conditions.  Past Medical History:  Diagnosis Date   Enlarged prostate     Patient Active Problem List   Diagnosis Date Noted   Bilateral inguinal hernia without obstruction or gangrene 02/01/2020   Nocturia 11/07/2019   Weight loss, unintentional 08/25/2017   Deformity of left hand 03/08/2017   Chronic midline thoracic back pain 03/08/2017   Benign prostatic hyperplasia with urinary frequency 12/06/2016   Varicocele present on ultrasound of scrotum 12/06/2016   Vitamin D deficiency 12/06/2016   HTN (hypertension), benign 11/08/2016   Constipation 12/17/2013   Hemorrhoids, external without complications [id number]   Special screening for malignant neoplasms, colon 12/17/2013    Past Surgical History:  Procedure Laterality Date   COLONOSCOPY N/A 04/26/2016   Procedure: COLONOSCOPY;  Surgeon: Danie Binder, MD;  [facility];  Service: Endoscopy;  Laterality: N/A;  [place] N/A 01/02/2018   Procedure: CYSTOSCOPY WITH INSERTION OF UROLIFT;  Surgeon: Cleon Gustin, MD;  Location: [facility];  Service: Urology;  Laterality: N/A;       Home Medications    Prior to Admission medications   Medication Sig Start Date End Date Taking? Authorizing Provider  benzonatate (TESSALON) 100 MG capsule Take  1 capsule (100 mg total) by mouth 3 (three) times daily as needed for cough. 08/23/22  Yes Barrett Henle, MD  fluticasone (FLONASE) 50 MCG/ACT nasal spray Place 2 sprays into both nostrils daily. 08/23/22  Yes Barrett Henle, MD  acetaminophen (TYLENOL) 500 MG tablet Take 500 mg by mouth every 6 (six) hours as needed.    [provider]    Family History Family History  Problem Relation Age of Onset   Colon cancer Neg Hx     Social History Social History   Tobacco Use   Smoking status: Former    Types: Cigarettes    Quit date: 04/26/1989    Years since quitting: 33.3   Smokeless tobacco: Never  Vaping Use   Vaping Use: Never used  Substance Use Topics   Alcohol use: Not Currently   Drug use: No     Allergies   Bactrim [sulfamethoxazole-trimethoprim]   Review of Systems Review of Systems  Respiratory:  Positive for cough.      Physical Exam Triage Vital Signs ED Triage Vitals  Enc Vitals Group     BP 08/23/22 1020 121/68     Pulse Rate 08/23/22 1020 78     Resp 08/23/22 1020 16     Temp 08/23/22 1020 98.8 F (37.1 C)     Temp Source 08/23/22 1020 Oral     SpO2 08/23/22 1020 98 %     Weight --  Height --      Head Circumference --      Peak Flow --      Pain Score 08/23/22 1024 0     Pain Loc --      Pain Edu? --      Excl. in [place]? --    No data found.  Updated Vital Signs BP 121/68 (BP Location: Left Arm)   Pulse 78   Temp 98.8 F (37.1 C) (Oral)   Resp 16   SpO2 98%   Visual Acuity Right Eye Distance:   Left Eye Distance:   Bilateral Distance:    Right Eye Near:   Left Eye Near:    Bilateral Near:     Physical Exam Vitals reviewed.  Constitutional:      General: He is not in acute distress.    Appearance: He is not toxic-appearing.  HENT:     Right Ear: Tympanic membrane and ear canal normal.     Left Ear: Tympanic membrane and ear canal normal.     Nose: Nose normal.     Mouth/Throat:     Mouth: Mucous membranes  are moist.     Pharynx: No oropharyngeal exudate or posterior oropharyngeal erythema.  Eyes:     Extraocular Movements: Extraocular movements intact.     Conjunctiva/sclera: Conjunctivae normal.     Pupils: Pupils are equal, round, and reactive to light.  Cardiovascular:     Rate and Rhythm: Normal rate and regular rhythm.     Heart sounds: No murmur heard. Pulmonary:     Effort: No respiratory distress.     Breath sounds: No stridor. No wheezing, rhonchi or rales.  Musculoskeletal:     Cervical back: Neck supple.  Lymphadenopathy:     Cervical: No cervical adenopathy.  Skin:    Capillary Refill: Capillary refill takes less than 2 seconds.     Coloration: Skin is not jaundiced or pale.  Neurological:     General: No focal deficit present.     Mental Status: He is alert and oriented to person, place, and time.  Psychiatric:        Behavior: Behavior normal.      UC Treatments / Results  Labs (all labs ordered are listed, but only abnormal results are displayed) Labs Reviewed  SARS CORONAVIRUS 2 (TAT 6-24 HRS)    EKG   Radiology No results found.  Procedures Procedures (including critical care time)  Medications Ordered in UC Medications - No data to display  Initial Impression / Assessment and Plan / UC Course  I have reviewed the triage vital signs and the nursing notes.  Pertinent labs & imaging results that were available during my care of the patient were reviewed by me and considered in my medical decision making (see chart for details).        Visit is conducted in [place].  It sounds like the new symptoms started about 72 hours ago.  COVID swab is done therefore, and if positive he is a candidate for Paxlovid.  Last EGFR was 102 in October 2023.  Tessalon Perles are sent in for the cough, and some Flonase is sent and, since he has had clear rhinorrhea in the last 2 weeks it could be allergies also. Final Clinical Impressions(s) / UC Diagnoses    Final diagnoses:  Viral upper respiratory tract infection     Discharge Instructions      Fluticasone/Flonase nose spray--put 2 sprays in each nostril once daily(2 esprays cada lado  de la nariz)  Take benzonatate 100 mg, 1 tab every 8 hours as needed for cough. (Tome 1 capsula 3 veces al dia cuando tiene tos)  You have been swabbed for COVID, and if positive staff will call you. (Hemos hecho una prueba de COVID. Si es positiva, las enfermeras le hablarian a decirle.        ED Prescriptions     Medication Sig Dispense Auth. Provider   fluticasone (FLONASE) 50 MCG/ACT nasal spray Place 2 sprays into both nostrils daily. 16 g Barrett Henle, MD   benzonatate (TESSALON) 100 MG capsule Take 1 capsule (100 mg total) by mouth 3 (three) times daily as needed for cough. 21 capsule Barrett Henle, MD      PDMP not reviewed this encounter.   Barrett Henle, MD 08/23/22 1051

## 2022-08-23 NOTE — ED Triage Notes (Signed)
Per Interpreter- Patient c/o a productive cough with white sputum and a sore throat x 4-5 days. Patient states he had a headache 2 days ago, but has stopped now.   Patient has been taking Ibuprofen and Vick's Dayquil tabs. Patient has not had any medication today.

## 2022-09-27 ENCOUNTER — Ambulatory Visit: Payer: Self-pay | Admitting: Internal Medicine

## 2022-12-30 ENCOUNTER — Encounter: Payer: Self-pay | Admitting: Internal Medicine

## 2022-12-30 ENCOUNTER — Ambulatory Visit: Payer: Self-pay | Admitting: Internal Medicine

## 2022-12-30 VITALS — BP 116/66 | HR 70 | Temp 98.2°F | Ht 69.0 in | Wt 161.0 lb

## 2022-12-30 DIAGNOSIS — E782 Mixed hyperlipidemia: Secondary | ICD-10-CM

## 2022-12-30 DIAGNOSIS — R0683 Snoring: Secondary | ICD-10-CM

## 2022-12-30 DIAGNOSIS — R5382 Chronic fatigue, unspecified: Secondary | ICD-10-CM

## 2022-12-30 DIAGNOSIS — Z125 Encounter for screening for malignant neoplasm of prostate: Secondary | ICD-10-CM

## 2022-12-30 NOTE — Progress Notes (Signed)
Patient ID: Troy Harrison, male    DOB: December 06, 1960  MRN: [id number]  CC: Hypertension (HTN f/u. Herold Harms blood work for liver & kidney function, urine testing/Reports feeling fatigue, )   Subjective: Troy Harrison is a 62 y.o. male who presents for chronic ds management His concerns today include:  history of HTN (control of med), HL, chronic midline thoracic back pain (OA and scoliosis), vitamin D deficiency, BPH and varicocele   [employer] Language interpreter used during this encounter. #[id number] My 1st yr Leonor Liv participated by taking the hx on this pt  C/o fatigue for yrs.  CBCs have been nl; TSH in past normal. Gets in bed around 8-9 p.m but if he has things to do, may be 10-11 p.m Drinks coffee around 6 p.m sometimes when his wife makes it.  If he drinks it he does not fall asleep until 2-3 a.m.  Does not drink ETOH beverages in past 40 yrs. He turns off all lights and sounds once in bed. Gets in about 6-8 hrs of sleep at nights; some nights he falls asleep easily but other times can take 2 hrs to fall sleep Wakes up 2-3 times during the night Wife says he snores loud.  Wakes feeling unrefresh. Sometimes feels sleepy when driving.  Had sleep study in 2016 but had difficulty initiating and maintaining sleep.  Could Not sleep with all the equipment that was attached to him.  Had minimal OSA  BP good today.  He has not been exercising as much because he has been busy with work.  Reports good eating habits. LDL mildly elev in past.  He is fasting for recheck Due for prostate cancer screening.  He is agreeable to PSA today. Patient Active Problem List   Diagnosis Date Noted   Bilateral inguinal hernia without obstruction or gangrene 02/01/2020   Nocturia 11/07/2019   Weight loss, unintentional 08/25/2017   Deformity of left hand 03/08/2017   Chronic midline thoracic back pain 03/08/2017   Benign prostatic hyperplasia with urinary frequency 12/06/2016    Varicocele present on ultrasound of scrotum 12/06/2016   Vitamin D deficiency 12/06/2016   HTN (hypertension), benign 11/08/2016   Constipation 12/17/2013   Hemorrhoids, external without complications 12/17/2013   Special screening for malignant neoplasms, colon 12/17/2013     Current Outpatient Medications on File Prior to Visit  Medication Sig Dispense Refill   acetaminophen (TYLENOL) 500 MG tablet Take 500 mg by mouth every 6 (six) hours as needed. (Patient not taking: Reported on 12/30/2022)     benzonatate (TESSALON) 100 MG capsule Take 1 capsule (100 mg total) by mouth 3 (three) times daily as needed for cough. (Patient not taking: Reported on 12/30/2022) 21 capsule 0   fluticasone (FLONASE) 50 MCG/ACT nasal spray Place 2 sprays into both nostrils daily. (Patient not taking: Reported on 12/30/2022) 16 g 0   No current facility-administered medications on file prior to visit.    Allergies  Allergen Reactions   Bactrim [Sulfamethoxazole-Trimethoprim] Itching    Social History   Socioeconomic History   Marital status: Married    Spouse name: Not on file   Number of children: Not on file   Years of education: Not on file   Highest education level: Not on file  Occupational History   Not on file  Tobacco Use   Smoking status: Former    Current packs/day: 0.00    Types: Cigarettes    Quit date: 04/26/1989    Years since quitting: 42.7  Smokeless tobacco: Never  Vaping Use   Vaping status: Never Used  Substance and Sexual Activity   Alcohol use: Not Currently   Drug use: No   Sexual activity: Yes  Other Topics Concern   Not on file  Social History Narrative   Not on file   Social Determinants of Health   Financial Resource Strain: Not on file  Food Insecurity: Not on file  Transportation Needs: Not on file  Physical Activity: Not on file  Stress: Not on file  Social Connections: Not on file  Intimate Partner Violence: Not on file    Family History  Problem  Relation Age of Onset   Colon cancer Neg Hx     Past Surgical History:  Procedure Laterality Date   COLONOSCOPY N/A 04/26/2016   Procedure: COLONOSCOPY;  Surgeon: West Bali, MD;  Location: [facility];  Service: Endoscopy;  Laterality: N/A;  1200   CYSTOSCOPY WITH INSERTION OF UROLIFT N/A 01/02/2018   Procedure: CYSTOSCOPY WITH INSERTION OF UROLIFT;  Surgeon: Malen Gauze, MD;  Location: [facility];  Service: Urology;  Laterality: N/A;    ROS: Review of Systems Negative except as stated above  PHYSICAL EXAM: BP 116/66 (BP Location: Left Arm, Patient Position: Sitting, Cuff Size: Normal)   Pulse 70   Temp 98.2 F (36.8 C) (Oral)   Ht 5\' 9"  (1.753 m)   Wt 161 lb (73 kg)   SpO2 99%   BMI 23.78 kg/m   Wt Readings from Last 3 Encounters:  12/30/22 161 lb (73 kg)  03/26/22 159 lb (72.1 kg)  06/26/21 161 lb 9.6 oz (73.3 kg)    Physical Exam   General appearance - alert, well appearing, and in no distress Mental status - normal mood, behavior, speech, dress, motor activity, and thought processes Eyes -Pink conjunctiva Mouth - mucous membranes moist, pharynx normal without lesions Chest - clear to auscultation, no wheezes, rales or rhonchi, symmetric air entry Heart - normal rate, regular rhythm, normal S1, S2, no murmurs, rubs, clicks or gallops     Latest Ref Rng & Units 03/26/2022   11:31 AM 06/26/2021    9:16 AM 07/21/2020    8:30 AM  CMP  Glucose 70 - 99 mg/dL 87  84  94   BUN 8 - 27 mg/dL 14  16  11    Creatinine 0.76 - 1.27 mg/dL 1.61  0.96  0.45   Sodium 134 - 144 mmol/L 141  139  137   Potassium 3.5 - 5.2 mmol/L 4.7  4.3  4.0   Chloride 96 - 106 mmol/L 101  100  100   CO2 20 - 29 mmol/L 23  26  24    Calcium 8.6 - 10.2 mg/dL 9.2  9.0  8.7   Total Protein 6.0 - 8.5 g/dL 6.8  6.4  6.8   Total Bilirubin 0.0 - 1.2 mg/dL 0.3  0.3  0.2   Alkaline Phos 44 - 121 IU/L 77  61  62   AST 0 - 40 IU/L 26  26  23    ALT 0 - 44 IU/L 20  22  19     Lipid Panel      Component Value Date/Time   CHOL 208 (H) 06/26/2021 0916   TRIG 72 06/26/2021 0916   HDL 90 06/26/2021 0916   CHOLHDL 2.3 06/26/2021 0916   CHOLHDL 2.1 03/14/2014 1220   VLDL 17 03/14/2014 1220   LDLCALC 105 (H) 06/26/2021 0916    CBC  Component Value Date/Time   WBC 5.0 03/26/2022 1131   WBC 5.0 12/28/2017 1230   RBC 5.27 03/26/2022 1131   RBC 4.87 12/28/2017 1230   HGB 16.6 03/26/2022 1131   HCT 47.1 03/26/2022 1131   PLT 225 03/26/2022 1131   MCV 89 03/26/2022 1131   MCH 31.5 03/26/2022 1131   MCH 30.6 12/28/2017 1230   MCHC 35.2 03/26/2022 1131   MCHC 33.6 12/28/2017 1230   RDW 12.8 03/26/2022 1131   LYMPHSABS 2.1 04/25/2019 1429   MONOABS 0.4 12/11/2013 1546   EOSABS 0.1 04/25/2019 1429   BASOSABS 0.0 04/25/2019 1429    ASSESSMENT AND PLAN: 1. Chronic fatigue 2. Loud snoring Good sleep hygiene discussed and encouraged. Advised to avoid drinking caffeinated beverages past 3 PM. I think it is worth doing another sleep study on him but giving a sleep aid for him to take the night of the study so that he is able to sleep during the study.  Patient is agreeable to this. - TSH - PSG Sleep Study; Future   3. Prostate cancer screening - PSA  4. Mixed hyperlipidemia - Lipid panel    Patient was given the opportunity to ask questions.  Patient verbalized understanding of the plan and was able to repeat key elements of the plan.   This documentation was completed using Paediatric nurse.  Any transcriptional errors are unintentional.  No orders of the defined types were placed in this encounter.    Requested Prescriptions    No prescriptions requested or ordered in this encounter    No follow-ups on file.  Jonah Blue, MD, FACP

## 2022-12-30 NOTE — Patient Instructions (Signed)
Please apply for [facility] and let  me know once approved so we can order sleep study

## 2022-12-31 LAB — LIPID PANEL
Chol/HDL Ratio: 2.4 ratio (ref 0.0–5.0)
Cholesterol, Total: 200 mg/dL — ABNORMAL HIGH (ref 100–199)
HDL: 83 mg/dL (ref >39)
LDL Chol Calc (NIH): 102 mg/dL — ABNORMAL HIGH (ref 0–99)
Triglycerides: 84 mg/dL (ref 0–149)
VLDL Cholesterol Cal: 15 mg/dL (ref 5–40)

## 2022-12-31 LAB — TSH: TSH: 1.52 u[IU]/mL (ref 0.450–4.500)

## 2022-12-31 LAB — PSA: Prostate Specific Ag, Serum: 0.8 ng/mL (ref 0.0–4.0)

## 2023-01-05 ENCOUNTER — Other Ambulatory Visit: Payer: Self-pay

## 2023-01-05 ENCOUNTER — Other Ambulatory Visit: Payer: Self-pay | Admitting: Internal Medicine

## 2023-01-05 MED ORDER — CLONAZEPAM 0.5 MG PO TABS
ORAL_TABLET | ORAL | 0 refills | Status: DC
  Filled 2023-01-05: qty 1, 1d supply, fill #0

## 2023-01-06 ENCOUNTER — Ambulatory Visit: Payer: Self-pay | Admitting: *Deleted

## 2023-01-06 NOTE — Telephone Encounter (Signed)
  Chief Complaint: Pt returned call and was given the message from Dr. Laural Benes. Symptoms: N/A Frequency: N/A Pertinent Negatives: Patient denies N/A Disposition: [] ED /[] Urgent Care (no appt availability in office) / [] Appointment(In office/virtual)/ []  [facility]/ [] Home Care/ [] Refused Recommended Disposition /[] [facility]/ []  Follow-up with PCP Additional Notes:

## 2023-01-06 NOTE — Telephone Encounter (Signed)
      Reason for Disposition  [1] Follow-up call to recent contact AND [2] information only call, no triage required  Answer Assessment - Initial Assessment Questions 1. REASON FOR CALL or QUESTION: "What is your reason for calling today?" or "How can I best help you?" or "What question do you have that I can help answer?"     Pt returned the call and was given the message from Dr. Jonah Blue dated 01/05/2023 at 3:28 PM.  No questions from pt.     Called in with Spanish interpreter Lars Mage [phone].  Protocols used: Information Only Call - No Triage-A-AH

## 2023-01-06 NOTE — Telephone Encounter (Signed)
Noted  

## 2023-01-12 ENCOUNTER — Other Ambulatory Visit: Payer: Self-pay

## 2023-01-29 ENCOUNTER — Other Ambulatory Visit: Payer: Self-pay

## 2023-01-29 ENCOUNTER — Observation Stay (HOSPITAL_COMMUNITY)
Admission: EM | Admit: 2023-01-29 | Discharge: 2023-01-30 | Disposition: A | Discharge status: Home / Self Care | Payer: Self-pay | Attending: Family Medicine | Admitting: Family Medicine

## 2023-01-29 ENCOUNTER — Encounter (HOSPITAL_COMMUNITY): Payer: Self-pay

## 2023-01-29 DIAGNOSIS — N401 Enlarged prostate with lower urinary tract symptoms: Secondary | ICD-10-CM | POA: Diagnosis present

## 2023-01-29 DIAGNOSIS — Z87891 Personal history of nicotine dependence: Secondary | ICD-10-CM | POA: Insufficient documentation

## 2023-01-29 DIAGNOSIS — M1380 Other specified arthritis, unspecified site: Secondary | ICD-10-CM | POA: Insufficient documentation

## 2023-01-29 DIAGNOSIS — M6282 Rhabdomyolysis: Secondary | ICD-10-CM

## 2023-01-29 DIAGNOSIS — Z1152 Encounter for screening for COVID-19: Secondary | ICD-10-CM | POA: Insufficient documentation

## 2023-01-29 DIAGNOSIS — R6510 Systemic inflammatory response syndrome (SIRS) of non-infectious origin without acute organ dysfunction: Secondary | ICD-10-CM

## 2023-01-29 DIAGNOSIS — N179 Acute kidney failure, unspecified: Principal | ICD-10-CM | POA: Diagnosis present

## 2023-01-29 DIAGNOSIS — M1990 Unspecified osteoarthritis, unspecified site: Secondary | ICD-10-CM | POA: Insufficient documentation

## 2023-01-29 LAB — COMPREHENSIVE METABOLIC PANEL WITH GFR
ALT: 24 U/L (ref 0–44)
AST: 30 U/L (ref 15–41)
Albumin: 4.2 g/dL (ref 3.5–5.0)
Alkaline Phosphatase: 53 U/L (ref 38–126)
Anion gap: 15 (ref 5–15)
BUN: 34 mg/dL — ABNORMAL HIGH (ref 8–23)
CO2: 23 mmol/L (ref 22–32)
Calcium: 9.6 mg/dL (ref 8.9–10.3)
Chloride: 95 mmol/L — ABNORMAL LOW (ref 98–111)
Creatinine, Ser: 2.04 mg/dL — ABNORMAL HIGH (ref 0.61–1.24)
GFR, Estimated: 36 mL/min — ABNORMAL LOW (ref >60)
Glucose, Bld: 151 mg/dL — ABNORMAL HIGH (ref 70–99)
Potassium: 4.5 mmol/L (ref 3.5–5.1)
Sodium: 133 mmol/L — ABNORMAL LOW (ref 135–145)
Total Bilirubin: 0.5 mg/dL (ref 0.3–1.2)
Total Protein: 7.2 g/dL (ref 6.5–8.1)

## 2023-01-29 LAB — CBC
HCT: 44.8 % (ref 39.0–52.0)
Hemoglobin: 15.5 g/dL (ref 13.0–17.0)
MCH: 30.4 pg (ref 26.0–34.0)
MCHC: 34.6 g/dL (ref 30.0–36.0)
MCV: 87.8 fL (ref 80.0–100.0)
Platelets: 282 10*3/uL (ref 150–400)
RBC: 5.1 MIL/uL (ref 4.22–5.81)
RDW: 13.1 % (ref 11.5–15.5)
WBC: 8.3 10*3/uL (ref 4.0–10.5)
nRBC: 0 % (ref 0.0–0.2)

## 2023-01-29 LAB — URINALYSIS, ROUTINE W REFLEX MICROSCOPIC
Bilirubin Urine: NEGATIVE
Glucose, UA: NEGATIVE mg/dL
Hgb urine dipstick: NEGATIVE
Ketones, ur: NEGATIVE mg/dL
Leukocytes,Ua: NEGATIVE
Nitrite: NEGATIVE
Protein, ur: NEGATIVE mg/dL
Specific Gravity, Urine: 1.02 (ref 1.005–1.030)
pH: 5 (ref 5.0–8.0)

## 2023-01-29 NOTE — ED Triage Notes (Signed)
Pt arrived from home via POV c/o generalized body cramps all over from neck to feet. 5/10 pain. Pt denies any other s/sx.

## 2023-01-30 ENCOUNTER — Emergency Department (HOSPITAL_COMMUNITY): Payer: Self-pay

## 2023-01-30 DIAGNOSIS — M1990 Unspecified osteoarthritis, unspecified site: Secondary | ICD-10-CM | POA: Insufficient documentation

## 2023-01-30 DIAGNOSIS — R6510 Systemic inflammatory response syndrome (SIRS) of non-infectious origin without acute organ dysfunction: Secondary | ICD-10-CM

## 2023-01-30 DIAGNOSIS — M6282 Rhabdomyolysis: Secondary | ICD-10-CM

## 2023-01-30 DIAGNOSIS — N179 Acute kidney failure, unspecified: Principal | ICD-10-CM | POA: Diagnosis present

## 2023-01-30 LAB — BASIC METABOLIC PANEL WITH GFR
Anion gap: 11 (ref 5–15)
BUN: 29 mg/dL — ABNORMAL HIGH (ref 8–23)
CO2: 21 mmol/L — ABNORMAL LOW (ref 22–32)
Calcium: 8.8 mg/dL — ABNORMAL LOW (ref 8.9–10.3)
Chloride: 102 mmol/L (ref 98–111)
Creatinine, Ser: 1.33 mg/dL — ABNORMAL HIGH (ref 0.61–1.24)
GFR, Estimated: >60 mL/min
Glucose, Bld: 106 mg/dL — ABNORMAL HIGH (ref 70–99)
Potassium: 4.1 mmol/L (ref 3.5–5.1)
Sodium: 134 mmol/L — ABNORMAL LOW (ref 135–145)

## 2023-01-30 LAB — RESP PANEL BY RT-PCR (RSV, FLU A&B, COVID)  RVPGX2
Influenza A by PCR: NEGATIVE
Influenza B by PCR: NEGATIVE
Resp Syncytial Virus by PCR: NEGATIVE
SARS Coronavirus 2 by RT PCR: NEGATIVE

## 2023-01-30 LAB — CK
Total CK: 838 U/L — ABNORMAL HIGH (ref 49–397)
Total CK: 950 U/L — ABNORMAL HIGH (ref 49–397)

## 2023-01-30 LAB — SEDIMENTATION RATE: Sed Rate: 3 mm/h (ref 0–16)

## 2023-01-30 MED ORDER — ONDANSETRON HCL 4 MG PO TABS: 4.0000 mg | ORAL_TABLET | Freq: Four times a day (QID) | ORAL | Status: DC | PRN

## 2023-01-30 MED ORDER — HYDROCODONE-ACETAMINOPHEN 5-325 MG PO TABS: 1.0000 | ORAL_TABLET | ORAL | Status: DC | PRN

## 2023-01-30 MED ORDER — SODIUM CHLORIDE 0.9 % IV BOLUS
1000.0000 mL | Freq: Once | INTRAVENOUS | Status: AC
  Administered 2023-01-30: 1000 mL via INTRAVENOUS

## 2023-01-30 MED ORDER — ACETAMINOPHEN 650 MG RE SUPP: 650.0000 mg | Freq: Four times a day (QID) | RECTAL | Status: DC | PRN

## 2023-01-30 MED ORDER — ONDANSETRON HCL 4 MG/2ML IJ SOLN: 4.0000 mg | Freq: Four times a day (QID) | INTRAMUSCULAR | Status: DC | PRN

## 2023-01-30 MED ORDER — ENOXAPARIN SODIUM 40 MG/0.4ML IJ SOSY
40.0000 mg | PREFILLED_SYRINGE | INTRAMUSCULAR | Status: DC
  Administered 2023-01-30: 40 mg via SUBCUTANEOUS
  Filled 2023-01-30: qty 0.4

## 2023-01-30 MED ORDER — SODIUM CHLORIDE 0.9 % IV SOLN: INTRAVENOUS | Status: DC

## 2023-01-30 MED ORDER — ACETAMINOPHEN 325 MG PO TABS: 650.0000 mg | ORAL_TABLET | Freq: Four times a day (QID) | ORAL | Status: DC | PRN

## 2023-01-30 NOTE — Discharge Summary (Addendum)
Physician Discharge Summary  Troy Harrison WUJ:[id number] DOB: Feb 08, 1961 DOA: 01/29/2023  PCP: Troy Matar, MD  Admit date: 01/29/2023 Discharge date: 01/30/2023  Time spent: 40 minutes  Recommendations for Outpatient Follow-up:  Follow outpatient CBC/CMP   Follow blood sugar outpatient, needs A1c  Discharge Diagnoses:  Principal Problem:   AKI (acute kidney injury) (HCC) Active Problems:   Rhabdomyolysis, mild   Benign prostatic hyperplasia with urinary frequency   SIRS (systemic inflammatory response syndrome) (HCC)   Arthritis   Discharge Condition: stable  Diet recommendation: heart healthy  Filed Weights   01/29/23 2324  Weight: 73 kg    History of present illness:   Troy Harrison is Troy Harrison 62 y.o. male with medical history significant for BPH, chronic thoracic back pain, chronic fatigue with prior negative workup(based on last PCPs note from 12/30/2022) and on no medication who presents to the ED with Troy Harrison 1 day history of severe cramping pain 10/10 felt all over his body, flanks, back, arms, legs and joints that started after Troy Harrison day of working outdoors.   Improved on day of discharge with IVF.  Stable for discharge with outpatient follow up.  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 Cardiovascular: Heart sounds show Troy Harrison regular rate, and rhythm.  Lungs: Clear to auscultation bilaterally  Abdomen: Soft, nontender, nondistended  Neurological: Alert and oriented 3. Moves all extremities 4 with equal strength. Cranial nerves II through XII grossly intact. Extremities: No clubbing or cyanosis. No edema.  Discharge Instructions   Discharge Instructions     Call MD for:  difficulty breathing, headache or visual disturbances   Complete by: As directed    Call MD for:  extreme fatigue   Complete by: As directed    Call MD for:  hives   Complete by: As directed    Call MD for:  persistant dizziness or light-headedness   Complete by: As directed    Call MD for:  persistant nausea and vomiting   Complete by: As directed    Call MD for:  redness, tenderness, or signs of infection (pain, swelling, redness, odor or green/yellow discharge around incision site)   Complete by: As directed    Call MD for:  severe uncontrolled pain   Complete by: As directed    Call MD for:  temperature >100.4   Complete by: As directed    Diet - low sodium heart healthy   Complete by: As directed    Discharge instructions  Complete by: As directed    You were seen for dehydration and cramping.  I think this was Troy Harrison heat related illness.   You've improved with hydration.  We'll let you go home today with plans to follow with your PCP as an outpatient.    Please follow with your PCP within 1 week for repeat labs.  Return for new, recurrent, or worsening symptoms.  Please ask your PCP to request records from this hospitalization so they  know what was done and what the next steps will be.   Increase activity slowly   Complete by: As directed       Allergies as of 01/30/2023       Reactions   Bactrim [sulfamethoxazole-trimethoprim] Itching        Medication List     STOP taking these medications    benzonatate 100 MG capsule Commonly known as: TESSALON   clonazePAM 0.5 MG tablet Commonly known as: KLONOPIN   fluticasone 50 MCG/ACT nasal spray Commonly known as: FLONASE       TAKE these medications    acetaminophen 500 MG tablet Commonly known as: TYLENOL Take 500 mg by mouth every 6 (six) hours as needed.       Allergies  Allergen Reactions   Bactrim [Sulfamethoxazole-Trimethoprim] Itching      The results of significant diagnostics from this hospitalization (including imaging, microbiology, ancillary and laboratory) are listed below for reference.    Significant Diagnostic Studies: US RENAL  Result Date: 01/30/2023 CLINICAL DATA:  Acute renal failure EXAM: RENAL / URINARY TRACT ULTRASOUND COMPLETE COMPARISON:  None Available. FINDINGS: Right Kidney: Renal measurements: 9.8 x 4.5 x 5 cm = volume: Was 17 mL. Hyperechoic echotexture. Hydronephrosis or solid mass. Left Kidney: Renal measurements: 11.4 x 5.1 x 4.7 cm = volume: 142 mL. Hyperechoic renal parenchyma. No hydronephrosis or solid renal mass. Bladder: Appears normal for degree of bladder distention. Other: None. IMPRESSION: Hyperechoic renal parenchyma consistent with medical renal disease. Electronically Signed   By: Troy Harrison M.D.   On: 01/30/2023 03:53    Microbiology: Recent Results (from the past 240 hour(s))  Resp panel by RT-PCR (RSV, Flu Troy Harrison&B, Covid) Anterior Nasal Swab     Status: None   Collection Time: 01/29/23 11:31 PM   Specimen: Anterior Nasal Swab  Result Value Ref Range Status   SARS Coronavirus 2 by RT PCR NEGATIVE NEGATIVE Final   Influenza Troy Harrison by PCR NEGATIVE NEGATIVE Final   Influenza B by PCR NEGATIVE NEGATIVE  Final    Comment: (NOTE) The Xpert Xpress SARS-CoV-2/FLU/RSV plus assay is intended as an aid in the diagnosis of influenza from Nasopharyngeal swab specimens and should not be used as Troy Harrison sole basis for treatment. Nasal washings and aspirates are unacceptable for Xpert Xpress SARS-CoV-2/FLU/RSV testing.  Fact Sheet for Patients: BloggerCourse.com  Fact Sheet for Healthcare Providers: SeriousBroker.it  This test is not yet approved or cleared by the [place] FDA and has been authorized for detection and/or diagnosis of SARS-CoV-2 by FDA under an Emergency Use Authorization (EUA). This EUA will remain in effect (meaning this test can be used) for the duration of the COVID-19 declaration under Section 564(b)(1) of the Act, 21 U.S.C. section 360bbb-3(b)(1), unless the authorization is terminated or revoked.     Resp Syncytial Virus by PCR NEGATIVE NEGATIVE Final    Comment: (NOTE) Fact Sheet for Patients: BloggerCourse.com  Fact Sheet for Healthcare Providers: SeriousBroker.it  This test is not yet approved or cleared by the [place]  FDA and has been authorized for detection and/or diagnosis of SARS-CoV-2 by FDA under an Emergency Use Authorization (EUA). This EUA will remain in effect (meaning this test can be used) for the duration of the COVID-19 declaration under Section 564(b)(1) of the Act, 21 U.S.C. section 360bbb-3(b)(1), unless the authorization is terminated or revoked.  Performed at [facility], [address] [place], [place] [address]      Labs: Basic Metabolic Panel: Recent Labs  Lab 01/29/23 2331 01/30/23 0417  NA 133* 134*  K 4.5 4.1  CL 95* 102  CO2 23 21*  GLUCOSE 151* 106*  BUN 34* 29*  CREATININE 2.04* 1.33*  CALCIUM 9.6 8.8*   Liver Function Tests: Recent Labs  Lab 01/29/23 2331  AST 30  ALT 24  ALKPHOS 53  BILITOT 0.5   PROT 7.2  ALBUMIN 4.2   No results for input(s): "LIPASE", "AMYLASE" in the last 168 hours. No results for input(s): "AMMONIA" in the last 168 hours. CBC: Recent Labs  Lab 01/29/23 2331  WBC 8.3  HGB 15.5  HCT 44.8  MCV 87.8  PLT 282   Cardiac Enzymes: Recent Labs  Lab 01/29/23 2331  CKTOTAL 838*   BNP: BNP (last 3 results) No results for input(s): "BNP" in the last 8760 hours.  ProBNP (last 3 results) No results for input(s): "PROBNP" in the last 8760 hours.  CBG: No results for input(s): "GLUCAP" in the last 168 hours.     Signed:  Lacretia Nicks MD.  [facility] 01/30/2023, 9:23 AM

## 2023-01-30 NOTE — Assessment & Plan Note (Signed)
Suspect secondary to dehydration, mild rhabdo IV hydration Monitor renal function and avoid nephrotoxins

## 2023-01-30 NOTE — Assessment & Plan Note (Signed)
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

## 2023-01-30 NOTE — Assessment & Plan Note (Addendum)
Resolved Patient had a low-grade temperature of 99.2 with mild tachycardia to 103 on arrival that improved with IV hydration Likely secondary to dehydration and mild rhabdo No stigmata of acute infection

## 2023-01-30 NOTE — Assessment & Plan Note (Signed)
CK 832 Will expect mild worsening before downtrend Continue hydration

## 2023-01-30 NOTE — Assessment & Plan Note (Signed)
S/p Urolift in 2019

## 2023-01-30 NOTE — Progress Notes (Signed)
Patient discharged to home with self care, all instructions given to patient and wife via interpreter ipad, patient verbalized an understanding of instructions.  He left the unit with his wife in no distress

## 2023-01-30 NOTE — H&P (Signed)
History and Physical    Patient: Troy Harrison EPP:[id number] DOB: 01-21-1961 DOA: 01/29/2023 DOS: the patient was seen and examined on 01/30/2023 PCP: Marcine Matar, MD  Patient coming from: Home  Chief Complaint:  Chief Complaint  Patient presents with   Generalized Body Aches    HPI: Troy Harrison is a 62 y.o. male with medical history significant for BPH, chronic thoracic back pain, chronic fatigue with prior negative workup(based on last PCPs note from 12/30/2022) and on no medication who presents to the ED with a 1 day history of severe cramping pain 10/10 felt all over his body, flanks, back, arms, legs and joints that started after a day of working outdoors.  He denies chest pain, nausea, vomiting, diarrhea, fever or chills and was previously in his usual state of health.  He denies headache or visual disturbance.  Patient noted to have deformities of proximal interphalangeal joints of the hands but he stated that this were not more painful than usual.  He did have pain in his large joints including wrists, shoulders in addition to the aforementioned areas. ED course and data review: Tmax 99.2 with pulse 103 and otherwise normal vitals. Labs notable for creatinine of 2.0 up from 0.76 about 10 months prior.  CK8 38.  CBC WNL, respiratory viral panel negative for COVID flu and RSV.  Urinalysis unremarkable. Renal ultrasound shows hyperechoic renal parenchyma consistent with medical renal disease. Patient received a 1 L NS bolus Hospitalist consulted for admission.   Review of Systems: As mentioned in the history of present illness. All other systems reviewed and are negative.  Past Medical History:  Diagnosis Date   Enlarged prostate    Past Surgical History:  Procedure Laterality Date   COLONOSCOPY N/A 04/26/2016   Procedure: COLONOSCOPY;  Surgeon: West Bali, MD;  Location: [facility];  Service: Endoscopy;  Laterality: N/A;  1200   CYSTOSCOPY WITH  INSERTION OF UROLIFT N/A 01/02/2018   Procedure: CYSTOSCOPY WITH INSERTION OF UROLIFT;  Surgeon: Malen Gauze, MD;  Location: [facility];  Service: Urology;  Laterality: N/A;   Social History:  reports that he quit smoking about 33 years ago. His smoking use included cigarettes. He has never used smokeless tobacco. He reports that he does not currently use alcohol. He reports that he does not use drugs.  Allergies  Allergen Reactions   Bactrim [Sulfamethoxazole-Trimethoprim] Itching    Family History  Problem Relation Age of Onset   Colon cancer Neg Hx     Prior to Admission medications   Medication Sig Start Date End Date Taking? Authorizing Provider  acetaminophen (TYLENOL) 500 MG tablet Take 500 mg by mouth every 6 (six) hours as needed. Patient not taking: Reported on 12/30/2022    [provider]  benzonatate (TESSALON) 100 MG capsule Take 1 capsule (100 mg total) by mouth 3 (three) times daily as needed for cough. Patient not taking: Reported on 12/30/2022 08/23/22   Zenia Resides, MD  clonazePAM (KLONOPIN) 0.5 MG tablet Take one tablet by mouth after arriving at the sleep lab for sleep study. 01/05/23   Marcine Matar, MD  fluticasone (FLONASE) 50 MCG/ACT nasal spray Place 2 sprays into both nostrils daily. Patient not taking: Reported on 12/30/2022 08/23/22   Zenia Resides, MD    Physical Exam: Vitals:   01/29/23 2323 01/29/23 2324 01/30/23 0230 01/30/23 0314  BP:   129/74 130/80  Pulse:   70 72  Resp:   14 15  Temp:  97.9 F (36.6 C)  TempSrc:    Oral  SpO2: 98%  99% 100%  Weight:  73 kg    Height:  5\' 9"  (1.753 m)     Physical Exam Vitals and nursing note reviewed.  Constitutional:      General: He is not in acute distress. HENT:     Head: Normocephalic and atraumatic.  Cardiovascular:     Rate and Rhythm: Normal rate and regular rhythm.     Heart sounds: Normal heart sounds.  Pulmonary:     Effort: Pulmonary effort is normal.     Breath  sounds: Normal breath sounds.  Abdominal:     Palpations: Abdomen is soft.     Tenderness: There is no abdominal tenderness.  Musculoskeletal:     Right hand: Deformity present.     Left hand: Deformity present.     Comments: Proximal interphalangeal joint swelling, all fingers  Neurological:     Mental Status: Mental status is at baseline.     Labs on Admission: I have personally reviewed following labs and imaging studies  CBC: Recent Labs  Lab 01/29/23 2331  WBC 8.3  HGB 15.5  HCT 44.8  MCV 87.8  PLT 282   Basic Metabolic Panel: Recent Labs  Lab 01/29/23 2331  NA 133*  K 4.5  CL 95*  CO2 23  GLUCOSE 151*  BUN 34*  CREATININE 2.04*  CALCIUM 9.6   GFR: Estimated Creatinine Clearance: 37.5 mL/min (A) (by C-G formula based on SCr of 2.04 mg/dL (H)). Liver Function Tests: Recent Labs  Lab 01/29/23 2331  AST 30  ALT 24  ALKPHOS 53  BILITOT 0.5  PROT 7.2  ALBUMIN 4.2   No results for input(s): "LIPASE", "AMYLASE" in the last 168 hours. No results for input(s): "AMMONIA" in the last 168 hours. Coagulation Profile: No results for input(s): "INR", "PROTIME" in the last 168 hours. Cardiac Enzymes: Recent Labs  Lab 01/29/23 2331  CKTOTAL 838*   BNP (last 3 results) No results for input(s): "PROBNP" in the last 8760 hours. HbA1C: No results for input(s): "HGBA1C" in the last 72 hours. CBG: No results for input(s): "GLUCAP" in the last 168 hours. Lipid Profile: No results for input(s): "CHOL", "HDL", "LDLCALC", "TRIG", "CHOLHDL", "LDLDIRECT" in the last 72 hours. Thyroid Function Tests: No results for input(s): "TSH", "T4TOTAL", "FREET4", "T3FREE", "THYROIDAB" in the last 72 hours. Anemia Panel: No results for input(s): "VITAMINB12", "FOLATE", "FERRITIN", "TIBC", "IRON", "RETICCTPCT" in the last 72 hours. Urine analysis:    Component Value Date/Time   COLORURINE YELLOW 01/29/2023 2232   APPEARANCEUR HAZY (A) 01/29/2023 2232   APPEARANCEUR Clear  03/26/2022 1131   LABSPEC 1.020 01/29/2023 2232   PHURINE 5.0 01/29/2023 2232   GLUCOSEU NEGATIVE 01/29/2023 2232   HGBUR NEGATIVE 01/29/2023 2232   BILIRUBINUR NEGATIVE 01/29/2023 2232   BILIRUBINUR Negative 03/26/2022 1131   KETONESUR NEGATIVE 01/29/2023 2232   PROTEINUR NEGATIVE 01/29/2023 2232   UROBILINOGEN negative (A) 11/07/2019 1000   UROBILINOGEN 0.2 01/12/2016 1338   NITRITE NEGATIVE 01/29/2023 2232   LEUKOCYTESUR NEGATIVE 01/29/2023 2232    Radiological Exams on Admission: US RENAL  Result Date: 01/30/2023 CLINICAL DATA:  Acute renal failure EXAM: RENAL / URINARY TRACT ULTRASOUND COMPLETE COMPARISON:  None Available. FINDINGS: Right Kidney: Renal measurements: 9.8 x 4.5 x 5 cm = volume: Was 17 mL. Hyperechoic echotexture. Hydronephrosis or solid mass. Left Kidney: Renal measurements: 11.4 x 5.1 x 4.7 cm = volume: 142 mL. Hyperechoic renal parenchyma. No hydronephrosis or solid  renal mass. Bladder: Appears normal for degree of bladder distention. Other: None. IMPRESSION: Hyperechoic renal parenchyma consistent with medical renal disease. Electronically Signed   By: Deatra Robinson M.D.   On: 01/30/2023 03:53     Data Reviewed: Relevant notes from primary care and specialist visits, past discharge summaries as available in EHR, including Care Everywhere. Prior diagnostic testing as pertinent to current admission diagnoses Updated medications and problem lists for reconciliation ED course, including vitals, labs, imaging, treatment and response to treatment Triage notes, nursing and pharmacy notes and ED provider's notes Notable results as noted in HPI   Assessment and Plan: * AKI (acute kidney injury) (HCC) Suspect secondary to dehydration, mild rhabdo IV hydration Monitor renal function and avoid nephrotoxins  Rhabdomyolysis, mild CK 832 Will expect mild worsening before downtrend Continue hydration  SIRS (systemic inflammatory response syndrome)  (HCC) Resolved Patient had a low-grade temperature of 99.2 with mild tachycardia to 103 on arrival that improved with IV hydration Likely secondary to dehydration and mild rhabdo No stigmata of acute infection  Benign prostatic hyperplasia with urinary frequency S/p Urolift in 2019  Arthritis Patient with finger joint deformities, likely chronic and related to OA  Had a negative screening test for Sjogren syndrome a few years prior and Autoimmune disease not suspected Will add on a sed rate   DVT prophylaxis:   Consults: none  Advance Care Planning: full code  Family Communication: none  Disposition Plan: Back to previous home environment  Severity of Illness: The appropriate patient status for this patient is OBSERVATION. Observation status is judged to be reasonable and necessary in order to provide the required intensity of service to ensure the patient's safety. The patient's presenting symptoms, physical exam findings, and initial radiographic and laboratory data in the context of their medical condition is felt to place them at decreased risk for further clinical deterioration. Furthermore, it is anticipated that the patient will be medically stable for discharge from the hospital within 2 midnights of admission.   Author: Andris Baumann, MD 01/30/2023 4:28 AM  For on call review [email or web address].

## 2023-01-30 NOTE — ED Provider Notes (Signed)
Troy Harrison EMERGENCY DEPARTMENT AT [facility] Provider Note   CSN: [id number] Arrival date & time: 01/29/23  2245     History  Chief Complaint  Patient presents with   Generalized Body Aches    Troy Harrison is a 62 y.o. male.  The history is provided by the patient, a significant other and medical records. A language interpreter was used.  Troy Harrison is a 62 y.o. male who presents to the Emergency Department complaining of bodyaches.  He presents to the emergency department for severe body aches that started on Saturday evening and lasted for about 4 hours.  He also reports a feeling of fatigue and unwellness for at least 1 to 2 years.  He states that he often gets cramping at night but never episodes that last this long.  He also feels hot at times but has not checked a temperature.  He he has loose stools at times but he does not describe it as diarrhea.  He has 2-3 BMs daily and does have urgency when he eats.  He takes occasional ibuprofen and Tylenol but none in the last month.  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  No significant work this week due to the weather. He does not take routine medications.  Home Medications Prior to Admission medications   Medication Sig Start Date End Date Taking? Authorizing Provider  acetaminophen (TYLENOL) 500 MG tablet Take 500 mg by mouth every 6 (six) hours as needed. Patient not taking: Reported on 12/30/2022    [provider]  benzonatate (TESSALON) 100 MG capsule Take 1 capsule (100 mg total) by mouth 3 (three) times daily as needed for cough. Patient not taking: Reported on 12/30/2022 08/23/22   Zenia Resides, MD  clonazePAM (KLONOPIN) 0.5 MG tablet Take one tablet by mouth after arriving at the sleep lab for sleep study. 01/05/23   Marcine Matar, MD  fluticasone (FLONASE) 50 MCG/ACT nasal spray Place 2 sprays into Harrison nostrils daily. Patient not taking: Reported on 12/30/2022  08/23/22   Zenia Resides, MD      Allergies    Bactrim [sulfamethoxazole-trimethoprim]    Review of Systems   Review of Systems  All other systems reviewed and are negative.   Physical Exam Updated Vital Signs BP 121/76   Pulse 68   Temp 98 F (36.7 C)   Resp 11   Ht 5\' 9"  (1.753 m)   Wt 73 kg   SpO2 100%   BMI 23.77 kg/m  Physical Exam Vitals and nursing note reviewed.  Constitutional:      Appearance: He is well-developed.  HENT:     Head: Normocephalic and atraumatic.  Cardiovascular:     Rate and Rhythm: Normal rate and regular rhythm.     Heart sounds: No murmur heard. Pulmonary:     Effort: Pulmonary effort is normal. No respiratory distress.     Breath sounds: Normal breath sounds.  Abdominal:     Palpations: Abdomen is soft.     Tenderness: There is no abdominal tenderness. There is no guarding or rebound.  Musculoskeletal:        General: No tenderness.  Skin:    General: Skin is warm and dry.  Neurological:     Mental Status: He is alert and oriented to person, place, and time.  Psychiatric:        Behavior: Behavior normal.     ED Results / Procedures / Treatments   Labs (all labs ordered  are listed, but only abnormal results are displayed) Labs Reviewed  COMPREHENSIVE METABOLIC PANEL - Abnormal; Notable for the following components:      Result Value   Sodium 133 (*)    Chloride 95 (*)    Glucose, Bld 151 (*)    BUN 34 (*)    Creatinine, Ser 2.04 (*)    GFR, Estimated 36 (*)    All other components within normal limits  URINALYSIS, ROUTINE W REFLEX MICROSCOPIC - Abnormal; Notable for the following components:   APPearance HAZY (*)    All other components within normal limits  CK - Abnormal; Notable for the following components:   Total CK 838 (*)    All other components within normal limits  BASIC METABOLIC PANEL - Abnormal; Notable for the following components:   Sodium 134 (*)    CO2 21 (*)    Glucose, Bld 106 (*)    BUN 29 (*)     Creatinine, Ser 1.33 (*)    Calcium 8.8 (*)    All other components within normal limits  RESP PANEL BY RT-PCR (RSV, FLU A&B, COVID)  [id number]  CBC  SEDIMENTATION RATE    EKG None  Radiology US RENAL  Result Date: 01/30/2023 CLINICAL DATA:  Acute renal failure EXAM: RENAL / URINARY TRACT ULTRASOUND COMPLETE COMPARISON:  None Available. FINDINGS: Right Kidney: Renal measurements: 9.8 x 4.5 x 5 cm = volume: Was 17 mL. Hyperechoic echotexture. Hydronephrosis or solid mass. Left Kidney: Renal measurements: 11.4 x 5.1 x 4.7 cm = volume: 142 mL. Hyperechoic renal parenchyma. No hydronephrosis or solid renal mass. Bladder: Appears normal for degree of bladder distention. Other: None. IMPRESSION: Hyperechoic renal parenchyma consistent with medical renal disease. Electronically Signed   By: Deatra Robinson M.D.   On: 01/30/2023 03:53    Procedures Procedures    Medications Ordered in ED Medications  enoxaparin (LOVENOX) injection 40 mg (has no administration in time range)  acetaminophen (TYLENOL) tablet 650 mg (has no administration in time range)    Or  acetaminophen (TYLENOL) suppository 650 mg (has no administration in time range)  ondansetron (ZOFRAN) tablet 4 mg (has no administration in time range)    Or  ondansetron (ZOFRAN) injection 4 mg (has no administration in time range)  0.9 %  sodium chloride infusion ( Intravenous Infusion Verify 01/30/23 0610)  HYDROcodone-acetaminophen (NORCO/VICODIN) 5-325 MG per tablet 1-2 tablet (has no administration in time range)  sodium chloride 0.9 % bolus 1,000 mL (0 mLs Intravenous Stopped 01/30/23 0416)  sodium chloride 0.9 % bolus 1,000 mL (0 mLs Intravenous Stopped 01/30/23 0455)    ED Course/ Medical Decision Making/ A&P                                 Medical Decision Making Amount and/or Complexity of Data Reviewed Labs: ordered. Radiology: ordered.  Risk Decision regarding hospitalization.   Patient here for evaluation of severe  body aches and cramping.  Labs significant for AKI, minimal elevation in his CPK not consistent with rhabdomyolysis.  Unclear source of his AKI.  Will start IV fluids and admit for observation.  Hospitalist consulted for admission.  Patient and wife updated findings of studies and they are in agreement with admission for observation.        Final Clinical Impression(s) / ED Diagnoses Final diagnoses:  AKI (acute kidney injury) (HCC)    Rx / DC Orders ED Discharge Orders  None         Tilden Fossa, MD 01/30/23 [phone]

## 2023-01-31 ENCOUNTER — Encounter (HOSPITAL_BASED_OUTPATIENT_CLINIC_OR_DEPARTMENT_OTHER): Payer: Self-pay | Admitting: Internal Medicine

## 2023-01-31 ENCOUNTER — Telehealth: Payer: Self-pay

## 2023-01-31 NOTE — Transitions of Care (Post Inpatient/ED Visit) (Signed)
   01/31/2023  Name: Kyung Woodman MRN: [id number] DOB: [birth year]  Today's TOC FU Call Status: Today's TOC FU Call Status:: Unsuccessful Call (1st Attempt) Unsuccessful Call (1st Attempt) Date: 01/31/23  Attempted to reach the patient regarding the most recent Inpatient/ED visit.  Follow Up Plan: Additional outreach attempts will be made to reach the patient to complete the Transitions of Care (Post Inpatient/ED visit) call.   Signature  Robyne Peers, RN

## 2023-02-01 ENCOUNTER — Telehealth: Payer: Self-pay

## 2023-02-01 NOTE — Transitions of Care (Post Inpatient/ED Visit) (Signed)
   02/01/2023  Name: Troy Harrison MRN: [id number] DOB: [birth year]  Today's TOC FU Call Status: Today's TOC FU Call Status:: Unsuccessful Call (2nd Attempt) Unsuccessful Call (1st Attempt) Date: 01/31/23 Unsuccessful Call (2nd Attempt) Date: 02/01/23  Attempted to reach the patient regarding the most recent Inpatient/ED visit.  Follow Up Plan: No further outreach attempts will be made at this time. We have been unable to contact the patient.  Signature  Robyne Peers, RN

## 2023-02-22 ENCOUNTER — Telehealth: Payer: Self-pay

## 2023-02-22 ENCOUNTER — Other Ambulatory Visit: Payer: Self-pay

## 2023-02-22 NOTE — Telephone Encounter (Signed)
The patient came to the clinic today stating that he was told to pick up medication prior to his sleep study.  He had a sleep study scheduled for 01/31/2023 that was cancelled and he had clonazepam ordered for that sleep study.  He never picked up the medication and now has a sleep study scheduled for 03/02/2023.  He is requesting the order for the medication be sent to his pharmacy.  Leana Roe assisted with Spanish interpretation.

## 2023-02-23 ENCOUNTER — Other Ambulatory Visit: Payer: Self-pay

## 2023-02-23 MED ORDER — CLONAZEPAM 0.5 MG PO TABS
ORAL_TABLET | ORAL | 0 refills | Status: DC
  Filled 2023-02-23: qty 2, 1d supply, fill #0

## 2023-02-23 NOTE — Telephone Encounter (Signed)
I spoke to the patient with assistance of Spanish Interpreter: [id number]/[employer] and informed him that Dr Laural Benes sent the order for clonazepam to [facility] at [facility].  He said he understood and repeated back the correct location of the pharmacy and said he would pick up the medication Friday or Monday.   He then confirmed the correct day and time of the sleep study and asked that I text him the location of the sleep study. The information was then text to him as he requested.

## 2023-02-28 ENCOUNTER — Other Ambulatory Visit: Payer: Self-pay

## 2023-03-02 ENCOUNTER — Ambulatory Visit (HOSPITAL_BASED_OUTPATIENT_CLINIC_OR_DEPARTMENT_OTHER): Payer: Self-pay | Attending: Internal Medicine | Admitting: Internal Medicine

## 2023-03-02 VITALS — Ht 69.0 in | Wt 170.0 lb

## 2023-03-02 DIAGNOSIS — G4761 Periodic limb movement disorder: Secondary | ICD-10-CM | POA: Insufficient documentation

## 2023-03-02 DIAGNOSIS — G4733 Obstructive sleep apnea (adult) (pediatric): Secondary | ICD-10-CM | POA: Insufficient documentation

## 2023-03-02 DIAGNOSIS — R5382 Chronic fatigue, unspecified: Secondary | ICD-10-CM | POA: Insufficient documentation

## 2023-03-02 DIAGNOSIS — R0683 Snoring: Secondary | ICD-10-CM | POA: Insufficient documentation

## 2023-03-02 DIAGNOSIS — I10 Essential (primary) hypertension: Secondary | ICD-10-CM | POA: Insufficient documentation

## 2023-03-05 DIAGNOSIS — R5382 Chronic fatigue, unspecified: Secondary | ICD-10-CM

## 2023-03-05 NOTE — Procedures (Signed)
Patient Name: Troy Harrison, Troy Harrison Date: 03/02/2023 Gender: Male D.O.B: [birth year] Age (years): 43 Referring Provider: Jonah Blue MD Height (inches): 69 Interpreting Physician: Jetty Duhamel MD, ABSM Weight (lbs): 170 RPSGT: Ulyess Mort BMI: 25 MRN: [id number] Neck Size: 13.50  CLINICAL INFORMATION Sleep Study Type: NPSG Indication for sleep study: Daytime Fatigue, Fatigue, Hypertension, Snoring Epworth Sleepiness Score: 7  Most recent polysomnogram dated 06/18/2014 revealed an AHI of 5.5/h  SLEEP STUDY TECHNIQUE As per the AASM Manual for the Scoring of Sleep and Associated Events v2.3 (April 2016) with a hypopnea requiring 4% desaturations.  The channels recorded and monitored were frontal, central and occipital EEG, electrooculogram (EOG), submentalis EMG (chin), nasal and oral airflow, thoracic and abdominal wall motion, anterior tibialis EMG, snore microphone, electrocardiogram, and pulse oximetry.  MEDICATIONS Medications self-administered by patient taken the night of the study : CLONAZEPAM  SLEEP ARCHITECTURE The study was initiated at 9:24:41 PM and ended at 4:28:05 AM.  Sleep onset time was 24.0 minutes and the sleep efficiency was 69.2%. The total sleep time was 293 minutes.  Stage REM latency was 77.0 minutes.  The patient spent 14.3% of the night in stage N1 sleep, 73.7% in stage N2 sleep, 0.0% in stage N3 and 12% in REM.  Alpha intrusion was absent.  Supine sleep was 42.64%.  RESPIRATORY PARAMETERS The overall apnea/hypopnea index (AHI) was 16.8 per hour. There were 41 total apneas, including 24 obstructive, 13 central and 4 mixed apneas. There were 41 hypopneas and 124 RERAs.  The AHI during Stage REM sleep was 17.1 per hour.  AHI while supine was 33.1 per hour.  The mean oxygen saturation was 95.0%. The minimum SpO2 during sleep was 84.0%.  soft snoring was noted during this study.  CARDIAC DATA The 2 lead EKG demonstrated  sinus rhythm. The mean heart rate was 69.8 beats per minute. Other EKG findings include: PVCs.  LEG MOVEMENT DATA The total PLMS were 0 with a resulting PLMS index of 0.0. Associated arousal with leg movement index was 9.2 .  IMPRESSIONS - Moderate obstructive sleep apnea occurred during this study (AHI = 16.8/h). - No significant central sleep apnea occurred during this study (CAI = 2.7/h). - Mild oxygen desaturation was noted during this study (Min O2 = 84.0%, Mean 95.3%). - The patient snored with soft snoring volume. - EKG findings include PVCs. - Limb movement total 286 (58.6/hr). Limb movements with arousal/ awakening 45 (9.2/hr).  DIAGNOSIS - Obstructive Sleep Apnea (G47.33) - Periodic Limb Movement  RECOMMENDATIONS - Suggest CPAP titration sleep study or autopap. Other options would be based on clinical judgment. - Consider trial of pregabalin or ropinirole for limb movement sleep disturbance. - Sleep hygiene should be reviewed to assess factors that may improve sleep quality. - Weight management and regular exercise should be initiated or continued if appropriate.  [Electronically signed] 03/05/2023 11:59 AM  Jetty Duhamel MD, ABSM Diplomate, American Board of Sleep Medicine NPI: [id number]                        Jetty Duhamel Diplomate, American Board of Sleep Medicine  ELECTRONICALLY SIGNED ON:  03/05/2023, 11:52 AM [facility] PH: [phone]   FX: [phone] ACCREDITED BY THE AMERICAN ACADEMY OF SLEEP MEDICINE

## 2023-03-09 ENCOUNTER — Telehealth: Payer: Self-pay | Admitting: Internal Medicine

## 2023-03-09 NOTE — Telephone Encounter (Signed)
Called & spoke to the patient. Verified name & DOB. Informed of results. Appointment scheduled for 04/07/2023. Patient confirmed appointment and expressed verbal understanding of all discussed.

## 2023-03-09 NOTE — Telephone Encounter (Signed)
Please let patient know that I received the results of his sleep study.  This confirms that he has sleep apnea.  Please give him an appointment with me to discuss diagnosis and management.

## 2023-03-21 NOTE — Progress Notes (Signed)
NA

## 2023-04-07 ENCOUNTER — Ambulatory Visit: Payer: Self-pay | Attending: Internal Medicine | Admitting: Internal Medicine

## 2023-04-07 ENCOUNTER — Other Ambulatory Visit: Payer: Self-pay

## 2023-04-07 ENCOUNTER — Encounter: Payer: Self-pay | Admitting: Internal Medicine

## 2023-04-07 VITALS — BP 102/62 | HR 78 | Temp 97.9°F | Ht 69.0 in | Wt 165.0 lb

## 2023-04-07 DIAGNOSIS — G4761 Periodic limb movement disorder: Secondary | ICD-10-CM

## 2023-04-07 DIAGNOSIS — M25552 Pain in left hip: Secondary | ICD-10-CM

## 2023-04-07 DIAGNOSIS — Z23 Encounter for immunization: Secondary | ICD-10-CM

## 2023-04-07 DIAGNOSIS — N289 Disorder of kidney and ureter, unspecified: Secondary | ICD-10-CM

## 2023-04-07 DIAGNOSIS — G4733 Obstructive sleep apnea (adult) (pediatric): Secondary | ICD-10-CM

## 2023-04-07 MED ORDER — ROPINIROLE HCL 0.25 MG PO TABS
0.2500 mg | ORAL_TABLET | Freq: Every day | ORAL | 3 refills | Status: DC
Start: 2023-04-07 — End: 2023-11-29
  Filled 2023-04-07: qty 30, 30d supply, fill #0

## 2023-04-07 NOTE — Progress Notes (Signed)
Patient ID: Troy Harrison, male    DOB: January 18, 1961  MRN: [id number]  CC: Apnea (Sleep apnea & treatment. Franchot Erichsen possibly starting medication to help sleep. /Intermittent pain on L leg X 4 days/Flu vax administered on 04/07/23 - C.A./)   Subjective: Troy Harrison is a 62 y.o. male who presents for chronic ds management. His concerns today include:  history of HTN (control of med), HL, chronic midline thoracic back pain (OA and scoliosis), vitamin D deficiency, BPH and varicocele    [employer] Language interpreter used during this encounter. #Edgar [phone]  Presents today to discuss his sleep study and management.  Test was ordered as he complained of fatigue  and loud snoring on last visit.  This revealed moderate obstructive sleep apnea with mild O2 desat with min of 84%.  Also noted was limb movements with arousal/awakening (PLM)  C/o intermittent pain in b/w LT buttock and hip;  Lasted 4 days from Thur-Sun of last wk,  was 7/10.  That was the last time. Sometimes knee hurts   Since last visit with me, patient was admitted to the hospital in August of this year with SIRS, AKI with elevated CK and severe cramping pain all over his body.  Symptoms improve with IV hydration.  Symptoms suspected to be related to dehydration after working outdoors all day the day prior to admission. GFR >60 by time of dischg  Patient Active Problem List   Diagnosis Date Noted   Chronic fatigue 03/02/2023   AKI (acute kidney injury) (HCC) 01/30/2023   Rhabdomyolysis, mild 01/30/2023   Arthritis 01/30/2023   SIRS (systemic inflammatory response syndrome) (HCC) 01/30/2023   Bilateral inguinal hernia without obstruction or gangrene 02/01/2020   Nocturia 11/07/2019   Weight loss, unintentional 08/25/2017   Deformity of left hand 03/08/2017   Chronic midline thoracic back pain 03/08/2017   Benign prostatic hyperplasia with urinary frequency 12/06/2016   Varicocele present on ultrasound of  scrotum 12/06/2016   Vitamin D deficiency 12/06/2016   HTN (hypertension), benign 11/08/2016   Constipation 12/17/2013   Hemorrhoids, external without complications 12/17/2013   Special screening for malignant neoplasms, colon 12/17/2013     Current Outpatient Medications on File Prior to Visit  Medication Sig Dispense Refill   acetaminophen (TYLENOL) 500 MG tablet Take 500 mg by mouth every 6 (six) hours as needed. (Patient not taking: Reported on 04/07/2023)     clonazePAM (KLONOPIN) 0.5 MG tablet Take 1 tab PO 30-60 mins prior to sleep study.  May repeat x 1 if needed. Do not drive or operate machinery after taking (Patient not taking: Reported on 04/07/2023) 2 tablet 0   No current facility-administered medications on file prior to visit.    Allergies  Allergen Reactions   Bactrim [Sulfamethoxazole-Trimethoprim] Itching    Social History   Socioeconomic History   Marital status: Married    Spouse name: Not on file   Number of children: Not on file   Years of education: Not on file   Highest education level: Not on file  Occupational History   Not on file  Tobacco Use   Smoking status: Former    Current packs/day: 0.00    Types: Cigarettes    Quit date: 04/26/1989    Years since quitting: 33.9   Smokeless tobacco: Never  Vaping Use   Vaping status: Never Used  Substance and Sexual Activity   Alcohol use: Not Currently   Drug use: No   Sexual activity: Yes  Other Topics Concern  Not on file  Social History Narrative   Not on file   Social Determinants of Health   Financial Resource Strain: Not on file  Food Insecurity: No Food Insecurity (01/30/2023)   Hunger Vital Sign    Worried About Running Out of Food in the Last Year: Never true    Ran Out of Food in the Last Year: Never true  Transportation Needs: No Transportation Needs (01/30/2023)   PRAPARE - Administrator, Civil Service (Medical): No    Lack of Transportation (Non-Medical): No   Physical Activity: Not on file  Stress: Not on file  Social Connections: Not on file  Intimate Partner Violence: Not At Risk (01/30/2023)   Humiliation, Afraid, Rape, and Kick questionnaire    Fear of Current or Ex-Partner: No    Emotionally Abused: No    Physically Abused: No    Sexually Abused: No    Family History  Problem Relation Age of Onset   Colon cancer Neg Hx     Past Surgical History:  Procedure Laterality Date   COLONOSCOPY N/A 04/26/2016   Procedure: COLONOSCOPY;  Surgeon: West Bali, MD;  Location: [facility];  Service: Endoscopy;  Laterality: N/A;  1200   CYSTOSCOPY WITH INSERTION OF UROLIFT N/A 01/02/2018   Procedure: CYSTOSCOPY WITH INSERTION OF UROLIFT;  Surgeon: Malen Gauze, MD;  Location: [facility];  Service: Urology;  Laterality: N/A;    ROS: Review of Systems Negative except as stated above  PHYSICAL EXAM: BP 102/62 (BP Location: Left Arm, Patient Position: Sitting, Cuff Size: Normal)   Pulse 78   Temp 97.9 F (36.6 C) (Oral)   Ht 5\' 9"  (1.753 m)   Wt 165 lb (74.8 kg)   SpO2 98%   BMI 24.37 kg/m   Physical Exam General appearance - alert, well appearing, older Hispanic male and in no distress Mental status - normal mood, behavior, speech, dress, motor activity, and thought processes Chest - clear to auscultation, no wheezes, rales or rhonchi, symmetric air entry Heart - normal rate, regular rhythm, normal S1, S2, no murmurs, rubs, clicks or gallops Musculoskeletal - good ROM LT hip and knee.  No point tenderness over LT SI jt     Latest Ref Rng & Units 01/30/2023    4:17 AM 01/29/2023   11:31 PM 03/26/2022   11:31 AM  CMP  Glucose 70 - 99 mg/dL 132  440  87   BUN 8 - 23 mg/dL 29  34  14   Creatinine 0.61 - 1.24 mg/dL 1.02  7.25  3.66   Sodium 135 - 145 mmol/L 134  133  141   Potassium 3.5 - 5.1 mmol/L 4.1  4.5  4.7   Chloride 98 - 111 mmol/L 102  95  101   CO2 22 - 32 mmol/L 21  23  23    Calcium 8.9 - 10.3 mg/dL 8.8  9.6  9.2    Total Protein 6.5 - 8.1 g/dL  7.2  6.8   Total Bilirubin 0.3 - 1.2 mg/dL  0.5  0.3   Alkaline Phos 38 - 126 U/L  53  77   AST 15 - 41 U/L  30  26   ALT 0 - 44 U/L  24  20    Lipid Panel     Component Value Date/Time   CHOL 200 (H) 12/30/2022 1344   TRIG 84 12/30/2022 1344   HDL 83 12/30/2022 1344   CHOLHDL 2.4 12/30/2022 1344  CHOLHDL 2.1 03/14/2014 1220   VLDL 17 03/14/2014 1220   LDLCALC 102 (H) 12/30/2022 1344    CBC    Component Value Date/Time   WBC 8.3 01/29/2023 2331   RBC 5.10 01/29/2023 2331   HGB 15.5 01/29/2023 2331   HGB 16.6 03/26/2022 1131   HCT 44.8 01/29/2023 2331   HCT 47.1 03/26/2022 1131   PLT 282 01/29/2023 2331   PLT 225 03/26/2022 1131   MCV 87.8 01/29/2023 2331   MCV 89 03/26/2022 1131   MCH 30.4 01/29/2023 2331   MCHC 34.6 01/29/2023 2331   RDW 13.1 01/29/2023 2331   RDW 12.8 03/26/2022 1131   LYMPHSABS 2.1 04/25/2019 1429   MONOABS 0.4 12/11/2013 1546   EOSABS 0.1 04/25/2019 1429   BASOSABS 0.0 04/25/2019 1429    ASSESSMENT AND PLAN: 1. OSA (obstructive sleep apnea) Went over with him diagnosis of OSA, what causes it, how it presents, treatment options and its effect on health.  Patient is uninsured.  I recommend sending message to our caseworker to work on trying to get him a refurbished or new CPAP device.  Patient is agreeable to this.  Her caseworker is on vacation this week so I will touch base with her next week. - For home use only DME continuous positive airway pressure (CPAP)  2. Periodic limb movement disorder (PLMD) Discussed this diagnosis with patient as well and how it interferes with restful sleep.  I recommend starting low-dose of Requip.  Went over possible side effects of the medication including compulsive behaviors, nausea/vomiting, drowsiness.  Advised to stop the medicine and let me know if he experiences any significant side effects.  He should take the medicine at bedtime. - rOPINIRole (REQUIP) 0.25 MG tablet; Take 1  tablet (0.25 mg total) by mouth at bedtime.  Dispense: 30 tablet; Refill: 3  3. Acute renal insufficiency We will recheck kidney function to make sure it is stayed stable since his hospitalization in August. - Basic Metabolic Panel  4. Pain of left hip This is not bothered him since last week.  Advised use of Tylenol as needed.  5. Encounter for immunization - Flu vaccine trivalent PF, 6mos and older(Flulaval,Afluria,Fluarix,Fluzone)    Patient was given the opportunity to ask questions.  Patient verbalized understanding of the plan and was able to repeat key elements of the plan.   This documentation was completed using Paediatric nurse.  Any transcriptional errors are unintentional.  Orders Placed This Encounter  Procedures   Flu vaccine trivalent PF, 6mos and older(Flulaval,Afluria,Fluarix,Fluzone)     Requested Prescriptions    No prescriptions requested or ordered in this encounter    No follow-ups on file.  Jonah Blue, MD, FACP

## 2023-04-08 LAB — BASIC METABOLIC PANEL
BUN/Creatinine Ratio: 23 (ref 10–24)
BUN: 19 mg/dL (ref 8–27)
CO2: 27 mmol/L (ref 20–29)
Calcium: 9.1 mg/dL (ref 8.6–10.2)
Chloride: 102 mmol/L (ref 96–106)
Creatinine, Ser: 0.81 mg/dL (ref 0.76–1.27)
Glucose: 67 mg/dL — ABNORMAL LOW (ref 70–99)
Potassium: 4.7 mmol/L (ref 3.5–5.2)
Sodium: 141 mmol/L (ref 134–144)
eGFR: 99 mL/min/{1.73_m2} (ref >59)

## 2023-04-15 ENCOUNTER — Telehealth: Payer: Self-pay

## 2023-04-15 NOTE — Telephone Encounter (Addendum)
Order received for CPAP machine for patient. I called him with the assistance of Spanish interpreter: [id number]/[employer]   He is currently uninsured  and stated he is not able to afford the cost of a new CPAP machine  I explained to him that the American Sleep [employer]nea Association (ASAA) has a CPAP assistance program and they provide refurbished CPAP machines for a $200 program fee donation.  The patient stated he is not able to afford the $200 fee.  I explained  that [facility] has funding available to pay the $200 donation for the American Sleep [employer] Association CPAP Assistance Program.   I explained that this is a one time grant from the clinic.    I explained that the refurbished CPAP machine from the ASAA will be delivered to [facility]. I then will schedule him to meet with a respiratory technician at the [facility] to review the use, cleaning and care of the machine.  I explained that there is no warranty or technical support that comes with the machine, it is provided " as is."    He said he has been approved for [employer] but I told him that does not cover the cost of the program fee for the machine.  I told him that he will need to come to the [facility] next week and sign the Patient Acknowledgement for the American Sleep [employer]nea Association as well as the Patient Acknowledgement for [facility] Financial Assistance. I also told him that he should speak to the [facility] Eligibility Caseworkers in our building to see if he would qualify for Medicaid. He said he understood and would come to the clinic next week. I provided him with the address for the clinic and told him to come during clinic hours and ask for me.

## 2023-04-26 NOTE — Telephone Encounter (Signed)
Patient has not come to sign the Financial Assistance Agreement and ASAA Patient Acknowledgement so we can place the order for the CPAP machine.   Leana Roe called the patient and reminded him that he needs to come to the clinic to sign the documents before the order can be placed.  He told her that he will be here to sign them between 11/6-11/01/2023.

## 2023-04-28 NOTE — Telephone Encounter (Signed)
Patient came to the clinic today and signed the Patient Assistance Acknowledgement and the ASAA Patient Acknowledgement with the assistance of Leana Roe interpreting in [place].

## 2023-04-28 NOTE — Telephone Encounter (Signed)
Application for the CPAP machine submitted on-line to the American Sleep [employer] Association- CPAP assistance program.  Prescription emailed to [email or web address]

## 2023-05-05 NOTE — Telephone Encounter (Signed)
Message received from the American [employer] Association stating they have a machine available for the patient and they are requesting payment.  $200 program fee paid by [facility].

## 2023-05-13 NOTE — Telephone Encounter (Signed)
I called the patient with assistance of Spanish Interpreter: [id number]/[employer] and informed him that we received his CPAP machine and he will be receiving a call from [facility] to schedule a time for  education about the use and the care of the machine and he will be able to pick  it up at that time.  He said he understood.

## 2023-06-10 ENCOUNTER — Ambulatory Visit (HOSPITAL_BASED_OUTPATIENT_CLINIC_OR_DEPARTMENT_OTHER): Payer: Self-pay | Attending: Internal Medicine

## 2023-06-12 NOTE — Telephone Encounter (Signed)
Patient  received his CPAP machine at the [facility] on 06/10/2023

## 2023-11-29 ENCOUNTER — Other Ambulatory Visit: Payer: Self-pay

## 2023-11-29 ENCOUNTER — Encounter: Payer: Self-pay | Admitting: Internal Medicine

## 2023-11-29 ENCOUNTER — Ambulatory Visit: Payer: Self-pay | Attending: Internal Medicine | Admitting: Internal Medicine

## 2023-11-29 VITALS — BP 107/66 | HR 68 | Temp 98.1°F | Ht 69.0 in | Wt 168.0 lb

## 2023-11-29 DIAGNOSIS — I10 Essential (primary) hypertension: Secondary | ICD-10-CM

## 2023-11-29 DIAGNOSIS — H9193 Unspecified hearing loss, bilateral: Secondary | ICD-10-CM

## 2023-11-29 DIAGNOSIS — E782 Mixed hyperlipidemia: Secondary | ICD-10-CM

## 2023-11-29 DIAGNOSIS — G4733 Obstructive sleep apnea (adult) (pediatric): Secondary | ICD-10-CM

## 2023-11-29 DIAGNOSIS — Z125 Encounter for screening for malignant neoplasm of prostate: Secondary | ICD-10-CM

## 2023-11-29 DIAGNOSIS — G4761 Periodic limb movement disorder: Secondary | ICD-10-CM

## 2023-11-29 DIAGNOSIS — M19042 Primary osteoarthritis, left hand: Secondary | ICD-10-CM

## 2023-11-29 DIAGNOSIS — M19041 Primary osteoarthritis, right hand: Secondary | ICD-10-CM

## 2023-11-29 MED ORDER — CELECOXIB 200 MG PO CAPS
200.0000 mg | ORAL_CAPSULE | Freq: Every day | ORAL | 3 refills | Status: AC
Start: 2023-11-29 — End: ?
  Filled 2023-11-29: qty 30, 30d supply, fill #0
  Filled 2024-03-30: qty 30, 30d supply, fill #1

## 2023-11-29 NOTE — Patient Instructions (Signed)
 Go to [facility] radiology department to get x-rays done of both hands.  Order has been placed. I have started you on a medication called Celebrex 200 mg daily to help with the arthritis stiffness that you are getting in both hands. Referral has been submitted for you to see the ear nose and throat specialist.  Please apply for the orange card/cone discount card.

## 2023-11-29 NOTE — Progress Notes (Signed)
 Patient ID: Troy Harrison, male    DOB: 26-Nov-1960  MRN: [id number]  CC: Hypertension (HTN f/u. /Intermittent cramps on fingers /Difficulty hearing on both ears, Intermittent sounds on L ear " feels like there is something inside")   Subjective: Troy Harrison is a 63 y.o. male who presents for chronic ds management. His concerns today include:  history of HTN (control of med), OSA on CPAP, PLMD on Requip , HL, chronic midline thoracic back pain (OA and scoliosis), vitamin D  deficiency, BPH and varicocele   AMN Language interpreter used during this encounter. #Victor [id number]  C/o worsening hearing in both ears. Has been going on for yrs. Hears ringing sounds sometimes in LT. Requests referral to specialist to have ears checked  OSA:  did received the CPAP machine and is using it consistently. Feels refreshed when he wakes in mornings. Was rxn Requip  for PLMD. Reports taking it but stopped taking because he felt the movement at nights subsided.  C/o cramps in hands intermittently x 1 mth. Evenings after work, his fingers feel stiff.  No swelling in fingers. Worries about whether he is developing same issue he had last yr where he developed severe cramps all over his body and was found to be dehydrated with elev CK. Reports he drinks a lot of water  to stay hydrated.  Works in Aeronautical engineer  Hx of HL, does well with eating habits.  No major wgh changes.    Patient Active Problem List   Diagnosis Date Noted   Chronic fatigue 03/02/2023   Arthritis 01/30/2023   Bilateral inguinal hernia without obstruction or gangrene 02/01/2020   Nocturia 11/07/2019   Deformity of left hand 03/08/2017   Chronic midline thoracic back pain 03/08/2017   Benign prostatic hyperplasia with urinary frequency 12/06/2016   Varicocele present on ultrasound of scrotum 12/06/2016   Vitamin D  deficiency 12/06/2016   HTN (hypertension), benign 11/08/2016   Constipation 12/17/2013   Hemorrhoids,  external without complications 12/17/2013     Current Outpatient Medications on File Prior to Visit  Medication Sig Dispense Refill   acetaminophen  (TYLENOL ) 500 MG tablet Take 500 mg by mouth every 6 (six) hours as needed. (Patient not taking: Reported on 04/07/2023)     rOPINIRole  (REQUIP ) 0.25 MG tablet Take 1 tablet (0.25 mg total) by mouth at bedtime. (Patient not taking: Reported on 11/29/2023) 30 tablet 3   No current facility-administered medications on file prior to visit.    Allergies  Allergen Reactions   Bactrim [Sulfamethoxazole-Trimethoprim ] Itching    Social History   Socioeconomic History   Marital status: Married    Spouse name: Not on file   Number of children: Not on file   Years of education: Not on file   Highest education level: Not on file  Occupational History   Not on file  Tobacco Use   Smoking status: Former    Current packs/day: 0.00    Types: Cigarettes    Quit date: 04/26/1989    Years since quitting: 34.6   Smokeless tobacco: Never  Vaping Use   Vaping status: Never Used  Substance and Sexual Activity   Alcohol use: Not Currently   Drug use: No   Sexual activity: Yes  Other Topics Concern   Not on file  Social History Narrative   Not on file   Social Drivers of Health   Financial Resource Strain: Not on file  Food Insecurity: No Food Insecurity (01/30/2023)   Hunger Vital Sign    Worried About Running  Out of Food in the Last Year: Never true    Ran Out of Food in the Last Year: Never true  Transportation Needs: No Transportation Needs (01/30/2023)   PRAPARE - Administrator, Civil Service (Medical): No    Lack of Transportation (Non-Medical): No  Physical Activity: Not on file  Stress: Not on file  Social Connections: Not on file  Intimate Partner Violence: Not At Risk (01/30/2023)   Humiliation, Afraid, Rape, and Kick questionnaire    Fear of Current or Ex-Partner: No    Emotionally Abused: No    Physically Abused: No     Sexually Abused: No    Family History  Problem Relation Age of Onset   Colon cancer Neg Hx     Past Surgical History:  Procedure Laterality Date   COLONOSCOPY N/A 04/26/2016   Procedure: COLONOSCOPY;  Surgeon: Alyce Jubilee, MD;  Location: [facility];  Service: Endoscopy;  Laterality: N/A;  1200   CYSTOSCOPY WITH INSERTION OF UROLIFT N/A 01/02/2018   Procedure: CYSTOSCOPY WITH INSERTION OF UROLIFT;  Surgeon: Marco Severs, MD;  Location: [facility];  Service: Urology;  Laterality: N/A;    ROS: Review of Systems Negative except as stated above  PHYSICAL EXAM: BP 107/66 (BP Location: Left Arm, Patient Position: Sitting, Cuff Size: Normal)   Pulse 68   Temp 98.1 F (36.7 C) (Oral)   Ht 5\' 9"  (1.753 m)   Wt 168 lb (76.2 kg)   SpO2 99%   BMI 24.81 kg/m   Wt Readings from Last 3 Encounters:  11/29/23 168 lb (76.2 kg)  04/07/23 165 lb (74.8 kg)  03/02/23 170 lb (77.1 kg)    Physical Exam   General appearance - alert, well appearing, and in no distress Mental status - normal mood, behavior, speech, dress, motor activity, and thought processes Eyes - pupils equal and reactive, extraocular eye movements intact Ears: both canal and TM nl Mouth - mucous membranes moist, pharynx normal without lesions Neck - supple, no significant adenopathy Chest - clear to auscultation, no wheezes, rales or rhonchi, symmetric air entry Heart - normal rate, regular rhythm, normal S1, S2, no murmurs, rubs, clicks or gallops Musculoskeletal - Hands: Grip 4+/5.  Noted enlargement of PIP and DIP joints with some Heberden's nodes of the PIP joints.  Mild contractures of the left middle ring and fifth digits at the PIP joints and right fifth PIP. Extremities - peripheral pulses normal, no pedal edema, no clubbing or cyanosis     Latest Ref Rng & Units 04/07/2023   11:00 AM 01/30/2023    4:17 AM 01/29/2023   11:31 PM  CMP  Glucose 70 - 99 mg/dL 67  161  096   BUN 8 - 27 mg/dL 19  29  34    Creatinine 0.76 - 1.27 mg/dL 0.45  4.09  8.11   Sodium 134 - 144 mmol/L 141  134  133   Potassium 3.5 - 5.2 mmol/L 4.7  4.1  4.5   Chloride 96 - 106 mmol/L 102  102  95   CO2 20 - 29 mmol/L 27  21  23    Calcium 8.6 - 10.2 mg/dL 9.1  8.8  9.6   Total Protein 6.5 - 8.1 g/dL   7.2   Total Bilirubin 0.3 - 1.2 mg/dL   0.5   Alkaline Phos 38 - 126 U/L   53   AST 15 - 41 U/L   30   ALT 0 -  44 U/L   24    Lipid Panel     Component Value Date/Time   CHOL 200 (H) 12/30/2022 1344   TRIG 84 12/30/2022 1344   HDL 83 12/30/2022 1344   CHOLHDL 2.4 12/30/2022 1344   CHOLHDL 2.1 03/14/2014 1220   VLDL 17 03/14/2014 1220   LDLCALC 102 (H) 12/30/2022 1344    CBC    Component Value Date/Time   WBC 8.3 01/29/2023 2331   RBC 5.10 01/29/2023 2331   HGB 15.5 01/29/2023 2331   HGB 16.6 03/26/2022 1131   HCT 44.8 01/29/2023 2331   HCT 47.1 03/26/2022 1131   PLT 282 01/29/2023 2331   PLT 225 03/26/2022 1131   MCV 87.8 01/29/2023 2331   MCV 89 03/26/2022 1131   MCH 30.4 01/29/2023 2331   MCHC 34.6 01/29/2023 2331   RDW 13.1 01/29/2023 2331   RDW 12.8 03/26/2022 1131   LYMPHSABS 2.1 04/25/2019 1429   MONOABS 0.4 12/11/2013 1546   EOSABS 0.1 04/25/2019 1429   BASOSABS 0.0 04/25/2019 1429    ASSESSMENT AND PLAN: 1. 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 Arthritis of both hands Based on exam this looks like osteoarthritis.  Will start him on Celebrex 200 mg once a day to help decrease the stiffness.  Advised that this would not reverse the process. - CBC - Rheumatoid factor - CYCLIC CITRUL PEPTIDE ANTIBODY, IGG/IGA - DG Hand Complete Left; Future - DG Hand Complete Right; Future - Sedimentation Rate - celecoxib (CELEBREX) 200 MG capsule; Take 1 capsule (200 mg total) by mouth daily.  Dispense: 30 capsule; Refill: 3  3. OSA (obstructive sleep apnea) Using and benefiting from CPAP.  Encouraged him to continue use.  4. Periodic limb  movement disorder (PLMD) Will DC Requip  since he feels he no longer needs it  5. Prostate cancer screening Agreeable to PSA screening. - PSA  6. Mixed hyperlipidemia Doing well with diet and is active as a landscaper. - Lipid panel  7. HTN (hypertension), benign Controlled off medication. - Comprehensive metabolic panel with GFR     Patient was given the opportunity to ask questions.  Patient verbalized understanding of the plan and was able to repeat key elements of the plan.   This documentation was completed using Paediatric nurse.  Any transcriptional errors are unintentional.  Orders Placed This Encounter  Procedures   DG Hand Complete Left   DG Hand Complete Right   CBC   Lipid panel   PSA   Comprehensive metabolic panel with GFR   Rheumatoid factor   CYCLIC CITRUL PEPTIDE ANTIBODY, IGG/IGA   Sedimentation Rate   Ambulatory referral to ENT     Requested Prescriptions    No prescriptions requested or ordered in this encounter    No follow-ups on file.  Concetta Dee, MD, FACP

## 2023-11-30 ENCOUNTER — Ambulatory Visit: Payer: Self-pay | Admitting: Internal Medicine

## 2023-11-30 LAB — LIPID PANEL
Chol/HDL Ratio: 2.5 ratio (ref 0.0–5.0)
Cholesterol, Total: 188 mg/dL (ref 100–199)
HDL: 74 mg/dL (ref >39)
LDL Chol Calc (NIH): 100 mg/dL — ABNORMAL HIGH (ref 0–99)
Triglycerides: 77 mg/dL (ref 0–149)
VLDL Cholesterol Cal: 14 mg/dL (ref 5–40)

## 2023-11-30 LAB — COMPREHENSIVE METABOLIC PANEL WITH GFR
ALT: 17 IU/L (ref 0–44)
AST: 22 IU/L (ref 0–40)
Albumin: 4.2 g/dL (ref 3.9–4.9)
Alkaline Phosphatase: 66 IU/L (ref 44–121)
BUN/Creatinine Ratio: 20 (ref 10–24)
BUN: 16 mg/dL (ref 8–27)
Bilirubin Total: 0.2 mg/dL (ref 0.0–1.2)
CO2: 23 mmol/L (ref 20–29)
Calcium: 9.1 mg/dL (ref 8.6–10.2)
Chloride: 101 mmol/L (ref 96–106)
Creatinine, Ser: 0.79 mg/dL (ref 0.76–1.27)
Globulin, Total: 2.3 g/dL (ref 1.5–4.5)
Glucose: 86 mg/dL (ref 70–99)
Potassium: 4.7 mmol/L (ref 3.5–5.2)
Sodium: 138 mmol/L (ref 134–144)
Total Protein: 6.5 g/dL (ref 6.0–8.5)
eGFR: 100 mL/min/{1.73_m2} (ref >59)

## 2023-11-30 LAB — CBC
Hematocrit: 47.9 % (ref 37.5–51.0)
Hemoglobin: 15.8 g/dL (ref 13.0–17.7)
MCH: 30.7 pg (ref 26.6–33.0)
MCHC: 33 g/dL (ref 31.5–35.7)
MCV: 93 fL (ref 79–97)
Platelets: 244 10*3/uL (ref 150–450)
RBC: 5.15 x10E6/uL (ref 4.14–5.80)
RDW: 13.6 % (ref 11.6–15.4)
WBC: 5.5 10*3/uL (ref 3.4–10.8)

## 2023-11-30 LAB — SEDIMENTATION RATE: Sed Rate: 2 mm/h (ref 0–30)

## 2023-11-30 LAB — RHEUMATOID FACTOR: Rheumatoid fact SerPl-aCnc: 10.6 [IU]/mL (ref <14.0)

## 2023-11-30 LAB — CYCLIC CITRUL PEPTIDE ANTIBODY, IGG/IGA

## 2023-11-30 LAB — PSA: Prostate Specific Ag, Serum: 1.1 ng/mL (ref 0.0–4.0)

## 2024-01-13 ENCOUNTER — Encounter (INDEPENDENT_AMBULATORY_CARE_PROVIDER_SITE_OTHER): Payer: Self-pay | Admitting: Otolaryngology

## 2024-03-09 ENCOUNTER — Ambulatory Visit (INDEPENDENT_AMBULATORY_CARE_PROVIDER_SITE_OTHER): Payer: Self-pay

## 2024-03-09 ENCOUNTER — Ambulatory Visit (HOSPITAL_COMMUNITY)
Admission: EM | Admit: 2024-03-09 | Discharge: 2024-03-09 | Disposition: A | Discharge status: Home / Self Care | Payer: Self-pay | Attending: Emergency Medicine | Admitting: Emergency Medicine

## 2024-03-09 ENCOUNTER — Other Ambulatory Visit: Payer: Self-pay

## 2024-03-09 ENCOUNTER — Encounter (HOSPITAL_COMMUNITY): Payer: Self-pay

## 2024-03-09 DIAGNOSIS — M1711 Unilateral primary osteoarthritis, right knee: Secondary | ICD-10-CM

## 2024-03-09 DIAGNOSIS — M25561 Pain in right knee: Secondary | ICD-10-CM

## 2024-03-09 DIAGNOSIS — M898X5 Other specified disorders of bone, thigh: Secondary | ICD-10-CM

## 2024-03-09 MED ORDER — DICLOFENAC SODIUM 1 % EX GEL
2.0000 g | Freq: Four times a day (QID) | CUTANEOUS | 0 refills | Status: AC
  Filled 2024-03-09: qty 100, 30d supply, fill #0

## 2024-03-09 NOTE — ED Triage Notes (Signed)
 Per interpreter, Pt c/o pain/burning feeling to bilateral leg/foot pain since yesterday. States hx of gout. Denies taken any meds.

## 2024-03-09 NOTE — ED Provider Notes (Signed)
 [facility]    CSN: [id number] Arrival date & time: 03/09/24  1458     History   Chief Complaint Chief Complaint  Patient presents with   Leg Pain    HPI Troy Harrison is a 62 y.o. male.  Medical interpretor used for encounter Having bilateral knee pain. Worsened yesterday. Reports left knee is tolerable, but the right is not. Denies numbness/tingling or weakness  Cramping sensation in the calves when he walks.  He does not have calf pain at rest. No swelling or erythema  No recent immobilization or procedure, no recent trauma  Has not taken any medications  Arthritis in the hands Has been given celebrex  in the past but does not take it.  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   Chronic midline thoracic back pain 03/08/2017   Benign prostatic hyperplasia with urinary frequency 12/06/2016   Varicocele present on ultrasound of scrotum 12/06/2016   Vitamin D  deficiency 12/06/2016   HTN (hypertension), benign 11/08/2016   Constipation 12/17/2013   Hemorrhoids, external without complications 12/17/2013    Past Surgical History:  Procedure Laterality Date   COLONOSCOPY N/A 04/26/2016   Procedure: COLONOSCOPY;  Surgeon: Margo LITTIE Haddock, MD;  Location: [facility];  Service: Endoscopy;  Laterality: N/A;  1200   CYSTOSCOPY WITH INSERTION OF UROLIFT N/A 01/02/2018   Procedure: CYSTOSCOPY WITH INSERTION OF UROLIFT;  Surgeon: Sherrilee Belvie LITTIE, MD;  Location: [facility];  Service: Urology;  Laterality: N/A;       Home Medications    Prior to Admission medications   Medication Sig Start Date End Date Taking? Authorizing Provider  diclofenac  Sodium (VOLTAREN ) 1 % GEL Apply 2 g topically 4 (four)  times daily. Aplique gel en la rodilla derecha 4 veces al da para Chief Technology Officer. 03/09/24  Yes Caterina Racine, Asberry, PA-C  celecoxib  (CELEBREX ) 200 MG capsule Take 1 capsule (200 mg total) by mouth daily. 11/29/23   Vicci Barnie NOVAK, MD    Family History Family History  Problem Relation Age of Onset   Colon cancer Neg Hx     Social History Social History   Tobacco Use   Smoking status: Former    Current packs/day: 0.00    Types: Cigarettes    Quit date: 04/26/1989    Years since quitting: 34.8   Smokeless tobacco: Never  Vaping Use   Vaping status: Never Used  Substance Use Topics   Alcohol use: Not Currently   Drug use: No     Allergies   Bactrim [sulfamethoxazole-trimethoprim ]   Review of Systems Review of Systems  As per HPI  Physical Exam Triage Vital Signs ED Triage Vitals  Encounter Vitals Group     BP 03/09/24 1613 136/73     Girls Systolic BP Percentile --      Girls Diastolic BP Percentile --      Boys Systolic BP Percentile --      Boys Diastolic BP Percentile --      Pulse Rate 03/09/24 1613 74     Resp 03/09/24 1613 18     Temp 03/09/24 1613 97.8 F (36.6 C)     Temp Source 03/09/24 1613 Oral     SpO2 03/09/24 1613 98 %  Weight --      Height --      Head Circumference --      Peak Flow --      Pain Score 03/09/24 1614 8     Pain Loc --      Pain Education --      Exclude from Growth Chart --    No data found.  Updated Vital Signs BP 136/73 (BP Location: Right Arm)   Pulse 74   Temp 97.8 F (36.6 C) (Oral)   Resp 18   SpO2 98%    Physical Exam Vitals and nursing note reviewed.  Constitutional:      General: He is not in acute distress. HENT:     Mouth/Throat:     Pharynx: Oropharynx is clear.  Cardiovascular:     Rate and Rhythm: Normal rate and regular rhythm.     Pulses: Normal pulses.     Heart sounds: Normal heart sounds.  Pulmonary:     Effort: Pulmonary effort is normal.     Breath sounds: Normal breath sounds.   Musculoskeletal:     Comments: No obvious swelling or deformity of the knees. No effusion. Full ROM bilaterally. No tenderness to palpation of knees, calves, shins, ankles, feet. No calf swelling or erythema. No cords palpated. Sensation is equal and intact. DP pulses 2+. Cap refill toes < 2 seconds.   Skin:    General: Skin is warm and dry.     Capillary Refill: Capillary refill takes less than 2 seconds.  Neurological:     Mental Status: He is alert and oriented to person, place, and time.     Sensory: Sensation is intact.     Motor: Motor function is intact.     Coordination: Coordination is intact.     Gait: Gait abnormal (antalgic).     UC Treatments / Results  Labs (all labs ordered are listed, but only abnormal results are displayed) Labs Reviewed - No data to display  EKG   Radiology DG Knee Complete 4 Views Right Result Date: 03/09/2024 CLINICAL DATA:  Pain and burning sensation since yesterday, history of arthritis EXAM: RIGHT KNEE - COMPLETE 4+ VIEW COMPARISON:  09/04/2008 FINDINGS: Frontal, bilateral oblique, and lateral views of the right knee are obtained. No acute fracture, subluxation, or dislocation. Mild 3 compartmental osteoarthritis greatest in the patellofemoral compartment. Small suprapatellar joint effusion. Since the prior exam, a broad exostosis as developed along the medial femoral metaphysis, which could reflect osteo chondroma. If pain localizes to this region, further evaluation with MRI may be useful. Soft tissues are unremarkable. IMPRESSION: 1. No acute displaced fracture. 2. Mild 3 compartmental osteoarthritis. 3. Small joint effusion. 4. 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 If pain localizes to this region, further evaluation with MRI may be useful. Electronically Signed   By: Ozell Daring M.D.   On: 03/09/2024 17:39    Procedures Procedures  Medications Ordered in UC Medications - No data to  display  Initial Impression / Assessment and Plan / UC Course  I have reviewed the triage vital signs and the nursing notes.  Pertinent labs & imaging results that were available during my care of the patient were reviewed by me and considered in my medical decision making (see chart for details).  Right knee xray with arthritis, small joint effusion. There is also a possible exostosis that may be consistent with osteochondroma.  Radiology recommends further evaluation with MRI. I have discussed  these results with patient For the arthritis and generalized knee pain I have provided a brace, and recommend Voltaren  gel 4 times daily.  I have advised him to contact his primary care provider for further management and outpatient MRI. He does have an appointment coming up in less than 1 month. Patient is agreeable to plan, all questions answered   Final Clinical Impressions(s) / UC Diagnoses   Final diagnoses:  Acute pain of right knee  Arthritis of right knee  Exostosis of right femur     Discharge Instructions      Tiene artritis en la rodilla. Es probable que esta sea la causa de su dolor. Use la rodillera como soporte. Puede aplicar el gel Voltaren  4 veces al da en la rodilla para reducir Chief Technology Officer. Su radiografa muestra que podra tener un crecimiento seo en la rodilla, que podra ser un tumor benigno. Comunquese con su mdico de cabecera para programar una cita de seguimiento. Es posible que soliciten ms imgenes para Data processing manager.     ED Prescriptions     Medication Sig Dispense Auth. Provider   diclofenac  Sodium (VOLTAREN ) 1 % GEL Apply 2 g topically 4 (four) times daily. Aplique gel en la rodilla derecha 4 veces al da para Chief Technology Officer. 100 g Shereese Bonnie, Asberry, PA-C      PDMP not reviewed this encounter.   Jeryl Asberry, [place] 03/09/24 1842

## 2024-03-09 NOTE — Discharge Instructions (Addendum)
 Tiene artritis en la rodilla. Es probable que esta sea la causa de su dolor. Use la rodillera como soporte. Puede aplicar el gel Voltaren  4 veces al da en la rodilla para reducir Chief Technology Officer. Su radiografa muestra que podra tener un crecimiento seo en la rodilla, que podra ser un tumor benigno. Comunquese con su mdico de cabecera para programar una cita de seguimiento. Es posible que soliciten ms imgenes para Data processing manager.

## 2024-03-14 ENCOUNTER — Ambulatory Visit (INDEPENDENT_AMBULATORY_CARE_PROVIDER_SITE_OTHER): Payer: Self-pay

## 2024-03-14 ENCOUNTER — Ambulatory Visit (INDEPENDENT_AMBULATORY_CARE_PROVIDER_SITE_OTHER): Payer: Self-pay | Admitting: Audiology

## 2024-03-14 VITALS — BP 112/69 | HR 67 | Temp 98.4°F | Ht 69.0 in | Wt 176.0 lb

## 2024-03-14 DIAGNOSIS — H903 Sensorineural hearing loss, bilateral: Secondary | ICD-10-CM

## 2024-03-14 DIAGNOSIS — H90A22 Sensorineural hearing loss, unilateral, left ear, with restricted hearing on the contralateral side: Secondary | ICD-10-CM

## 2024-03-14 DIAGNOSIS — H9313 Tinnitus, bilateral: Secondary | ICD-10-CM

## 2024-03-14 DIAGNOSIS — H918X3 Other specified hearing loss, bilateral: Secondary | ICD-10-CM

## 2024-03-14 DIAGNOSIS — H90A21 Sensorineural hearing loss, unilateral, right ear, with restricted hearing on the contralateral side: Secondary | ICD-10-CM

## 2024-03-14 NOTE — Progress Notes (Signed)
  [address], [address] [place], [place] [address] [phone]  Audiological Evaluation    Name: Troy Harrison     DOB:   May 09, 1961      MRN:   [id number]                                                                                     Service Date: 03/14/2024     Accompanied by: unaccompanied   Patient comes today after Dr. Penne Croak, DO sent a referral for a hearing evaluation due to concerns with hearing loss asymmetry. Patient was tested by a Spanish speaking audiologist.   Symptoms Yes Details  Hearing loss  [x]  Maybe more in the left ear  Tinnitus  [x]  More in the left ear  Ear pain/ infections/pressure  []    Balance problems  []    Noise exposure history  [x]  Occupational - remembers was told to have more loss in one ear during the hearing screenings  Previous ear surgeries  []    Family history of hearing loss  []    Amplification  []    Other  []      Otoscopy: Right ear: Clear external ear canal and notable landmarks visualized on the tympanic membrane. Left ear:  Clear external ear canal and notable landmarks visualized on the tympanic membrane.  Tympanometry: Right ear: Type A- Normal external ear canal volume with normal middle ear pressure and tympanic membrane compliance. Left ear: Type A- Normal external ear canal volume with normal middle ear pressure and tympanic membrane compliance.  Pure tone Audiometry: Right ear- Borderline normal to severe sensorineural hearing loss from 125 Hz - 8000 Hz. Left ear-  Borderline normal to severe sensorineural hearing loss from 125 Hz - 8000 Hz.  Speech Audiometry: Right ear- Speech Reception Threshold (SRT) was obtained at 35 dBHL. Left ear-Speech Reception Threshold (SRT) was obtained at 40 dBHL.   Word Recognition Score Tested using Spanish list (recorded) and also live voice as patient seemed to do better with live voice. Right ear: 44% was obtained at a presentation level of 75 dBHL with  contralateral masking which is deemed as  poor. Left ear: 56% was obtained at a presentation level of 80 dBHL with contralateral masking which is deemed as  poor.   The hearing test results were completed under headphones and results are deemed to be of good to fair reliability. Test technique:  conventional    Impression: There is a significant difference in pure-tone thresholds between ears, worse in the left ear.   Recommendations: Follow up with ENT as scheduled for today. Return for a hearing evaluation if concerns with hearing changes arise or per MD recommendation. Consider a communication needs assessment after medical clearance for hearing aids is obtained.   Twan Harkin MARIE LEROUX-MARTINEZ, AUD

## 2024-03-14 NOTE — Progress Notes (Signed)
 Dear Dr. Vicci, Here is my assessment for our mutual patient, Troy Harrison. Thank you for allowing me the opportunity to care for your patient. Please do not hesitate to contact me should you have any other questions. Sincerely, Dr. Penne Croak  Otolaryngology Clinic Note Referring provider: Dr. Vicci HPI:  Troy Harrison is a 63 y.o. male kindly referred by Dr. Vicci for evaluation of bilateral hearing loss and tinnitus.   The patient reports progressive hearing loss over the past five years, which has worsened significantly. They work in [employer] where it is very loud, which might have contributed to the hearing loss. The patient has not previously used hearing aids. They experience ringing in both ears, which they describe as similar to having an insect inside. No pain, vertigo, or drainage. No hx of surgery.   History gathered from patient via spanish interpreter.   Independent Review of Additional Tests or Records:  I personally ordered, reviewed and interpreted audiogram.   Borderline mild SNHL downsloping to profound SNHL in the left and severe in the right. Left slightly worse than the right across 3 frequencies. Right ear SRT 35, left ear 40. Word discrim 44% on the right and 56% on the left. Spanish speaking audiologist used.   PMH/Meds/All/SocHx/FamHx/ROS:   Past Medical History:  Diagnosis Date   Enlarged prostate      Past Surgical History:  Procedure Laterality Date   COLONOSCOPY N/A 04/26/2016   Procedure: COLONOSCOPY;  Surgeon: Margo LITTIE Haddock, MD;  Location: [facility];  Service: Endoscopy;  Laterality: N/A;  1200   CYSTOSCOPY WITH INSERTION OF UROLIFT N/A 01/02/2018   Procedure: CYSTOSCOPY WITH INSERTION OF UROLIFT;  Surgeon: Sherrilee Belvie LITTIE, MD;  Location: [facility];  Service: Urology;  Laterality: N/A;    Family History  Problem Relation Age of Onset   Colon cancer Neg Hx      Social Connections: Not on file      Current  Outpatient Medications:    celecoxib  (CELEBREX ) 200 MG capsule, Take 1 capsule (200 mg total) by mouth daily. (Patient not taking: Reported on 03/14/2024), Disp: 30 capsule, Rfl: 3   diclofenac  Sodium (VOLTAREN ) 1 % GEL, Apply 2 g topically 4 (four) times daily. Aplique gel en la rodilla derecha 4 veces al da para Chief Technology Officer. (Patient not taking: Reported on 03/14/2024), Disp: 100 g, Rfl: 0   Physical Exam:   BP 112/69 (BP Location: Left Arm, Patient Position: Sitting)   Pulse 67   Temp 98.4 F (36.9 C)   Ht 5' 9 (1.753 m)   Wt 176 lb (79.8 kg)   SpO2 96%   BMI 25.99 kg/m   The patient was awake, alert, and appropriate. The external ears were inspected, and otoscopy was performed to evaluate the external auditory canals and tympanic membranes. The nasal cavity and septum were examined for mucosal changes, obstruction, or discharge. The oral cavity and oropharynx were inspected for mucosal lesions, infection, or tonsillar hypertrophy. The neck was palpated for lymphadenopathy, thyroid abnormalities, or other masses. Cranial nerve function was grossly intact.  Pertinent Findings: Normal EAC and TM with normal landmarks. Aerated middle ears.  Missing upper and lower teeth with cavities.  No abscess or masses.   Seprately Identifiable Procedures:  I personally ordered, reviewed and interpreted the following with the patient today  Procedure: Bilateral ear microscopy using microscope (CPT [phone]) Pre-procedure diagnosis: tinnitus and SNHL Post-procedure diagnosis: same Indication: see above; given patient's otologic complaints and history, for improved and comprehensive  examination of external ear and tympanic membrane, bilateral otologic examination using microscope was performed. Prior to proceeding, verbal consent was obtained after discussion of R/B/A  Procedure: Patient was placed semi-recumbent. Both ear canals were examined using the microscope with findings below. Patient tolerated the  procedure well.  Right ear:  No significant lesions pinna. EAC: no significant lesions. Canal is clear. Eczematoid changes. absent TM: Intact    Impression & Plans:  Troy Harrison is a 63 y.o. male with severe exacerbation of SNHL and bilateral tinnitus  1. Asymmetrical hearing loss   2. 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 Tinnitus of both ears   4. Sensorineural hearing loss (SNHL) of right ear with restricted hearing of left ear     Plan - Findings and diagnoses discussed in detail with the patient. - Risks, benefits, and alternatives were reviewed. Through shared decision making, the patient elects to proceed with hearing aids. Contact information provided. - He is interested in CI evaluation. -[name]'s care is complicated by spanish speaking and insurance limitations. Will try referral  - placed to Dr. Sioshansi. - MRI placed to rule out retrocochlear process and presurgical planning.  - Orders placed:  Orders Placed This Encounter  Procedures   MR BRAIN/IAC W WO CONTRAST   Ambulatory referral to ENT   - Medications prescribed/continued/adjusted: No orders of the defined types were placed in this encounter.  - Education materials provided to the patient. - Follow up: 3 months. Patient instructed to return sooner or go to the ED if new/worsening symptoms develop.   Thank you for allowing me the opportunity to care for your patient. Please do not hesitate to contact me should you have any other questions.  Sincerely, Penne Croak, DO Otolaryngologist (ENT) [facility] Phone: [phone] Fax: [phone]  03/14/2024, 10:55 AM

## 2024-03-20 ENCOUNTER — Encounter (INDEPENDENT_AMBULATORY_CARE_PROVIDER_SITE_OTHER): Payer: Self-pay

## 2024-03-28 ENCOUNTER — Telehealth: Payer: Self-pay | Admitting: Internal Medicine

## 2024-03-28 NOTE — Telephone Encounter (Signed)
 Patient confirmed appointment for 03/30/2024 through volunteer call.

## 2024-03-30 ENCOUNTER — Ambulatory Visit: Payer: Self-pay | Attending: Internal Medicine | Admitting: Internal Medicine

## 2024-03-30 ENCOUNTER — Encounter: Payer: Self-pay | Admitting: Internal Medicine

## 2024-03-30 ENCOUNTER — Other Ambulatory Visit: Payer: Self-pay

## 2024-03-30 VITALS — BP 114/65 | HR 66 | Temp 97.9°F | Ht 69.0 in | Wt 164.0 lb

## 2024-03-30 DIAGNOSIS — M19041 Primary osteoarthritis, right hand: Secondary | ICD-10-CM

## 2024-03-30 DIAGNOSIS — H903 Sensorineural hearing loss, bilateral: Secondary | ICD-10-CM

## 2024-03-30 DIAGNOSIS — Z23 Encounter for immunization: Secondary | ICD-10-CM

## 2024-03-30 DIAGNOSIS — Z791 Long term (current) use of non-steroidal anti-inflammatories (NSAID): Secondary | ICD-10-CM

## 2024-03-30 DIAGNOSIS — M19042 Primary osteoarthritis, left hand: Secondary | ICD-10-CM

## 2024-03-30 DIAGNOSIS — G4733 Obstructive sleep apnea (adult) (pediatric): Secondary | ICD-10-CM

## 2024-03-30 NOTE — Progress Notes (Signed)
 Patient ID: Troy Harrison, male    DOB: 1961/05/06  MRN: [id number]  CC: Hypertension (HTN f/u. /Pt referred to specialist in [place] to flu vax)   Subjective: Troy Harrison is a 63 y.o. male who presents for chronic ds management. 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 #Victor [phone]   Discussed the use of AI scribe software for clinical note transcription with the patient, who gave verbal consent to proceed.  History of Present Illness Troy Harrison is a 63 year old male with hearing loss and arthritis who presents for follow-up on his hearing evaluation and arthritis management.  He underwent a hearing evaluation which confirmed hearing loss, more pronounced on the left side. He is scheduled for a brain MRI on the fifteenth of this month as ordered by the otolaryngologist to rule out retrotrochlea process and presurgical planning. He also has an upcoming appointment with an ENT specialist at [facility] on the 25th of this month.  He has arthritis affecting his fingers and knees, causing intermittent discomfort. He was prescribed Celebrex  for this condition on last visit but has not been taking it regularly, only using it when experiencing significant discomfort. He has not taken it for several days.  He has a history of sleep apnea and has a  CPAP machine but discontinued its use due to discomfort with the mask. He is interested in exploring alternative mask options that are less cumbersome.  HM: agrees to flu shot. Also needs PCV 20, advise to get at [facility].   Patient Active Problem List   Diagnosis Date Noted   Chronic fatigue 03/02/2023   Arthritis 01/30/2023    Bilateral inguinal hernia without obstruction or gangrene 02/01/2020   Nocturia 11/07/2019   Deformity of left hand 03/08/2017   Chronic midline thoracic back pain 03/08/2017   Benign prostatic hyperplasia with urinary frequency 12/06/2016   Varicocele present on ultrasound of scrotum 12/06/2016   Vitamin D  deficiency 12/06/2016   HTN (hypertension), benign 11/08/2016   Constipation 12/17/2013   Hemorrhoids, external without complications 12/17/2013     Current Outpatient Medications on File Prior to Visit  Medication Sig Dispense Refill   celecoxib  (CELEBREX ) 200 MG capsule Take 1 capsule (200 mg total) by mouth daily. (Patient not taking: Reported on 03/30/2024) 30 capsule 3   diclofenac  Sodium (VOLTAREN ) 1 % GEL Apply 2 g topically 4 (four) times daily. Aplique gel en la rodilla derecha 4 veces al da para Chief Technology Officer. (Patient not taking: Reported on 03/30/2024) 100 g 0   No current facility-administered medications on file prior to visit.    Allergies  Allergen Reactions   Bactrim [Sulfamethoxazole-Trimethoprim ] Itching    Social History   Socioeconomic History   Marital status: Married    Spouse name: Not on file   Number of children: Not on file   Years of education: Not on file   Highest education level: Not on file  Occupational History   Not on file  Tobacco Use   Smoking status: Former    Current packs/day: 0.00    Types: Cigarettes    Quit date: 04/26/1989    Years since quitting: 34.9   Smokeless tobacco: Never  Vaping Use   Vaping  status: Never Used  Substance and Sexual Activity   Alcohol use: Not Currently   Drug use: No   Sexual activity: Yes  Other Topics Concern   Not on file  Social History Narrative   Not on file   Social Drivers of Health   Financial Resource Strain: Not on file  Food Insecurity: No Food Insecurity (01/30/2023)   Hunger Vital Sign    Worried About Running Out of Food in the Last Year: Never true    Ran Out of Food in the  Last Year: Never true  Transportation Needs: No Transportation Needs (01/30/2023)   PRAPARE - Administrator, Civil Service (Medical): No    Lack of Transportation (Non-Medical): No  Physical Activity: Not on file  Stress: Not on file  Social Connections: Not on file  Intimate Partner Violence: Not At Risk (01/30/2023)   Humiliation, Afraid, Rape, and Kick questionnaire    Fear of Current or Ex-Partner: No    Emotionally Abused: No    Physically Abused: No    Sexually Abused: No    Family History  Problem Relation Age of Onset   Colon cancer Neg Hx     Past Surgical History:  Procedure Laterality Date   COLONOSCOPY N/A 04/26/2016   Procedure: COLONOSCOPY;  Surgeon: Margo LITTIE Haddock, MD;  Location: [facility];  Service: Endoscopy;  Laterality: N/A;  1200   CYSTOSCOPY WITH INSERTION OF UROLIFT N/A 01/02/2018   Procedure: CYSTOSCOPY WITH INSERTION OF UROLIFT;  Surgeon: Sherrilee Belvie LITTIE, MD;  Location: [facility];  Service: Urology;  Laterality: N/A;    ROS: Review of Systems Negative except as stated above  PHYSICAL EXAM: BP 114/65 (BP Location: Left Arm, Patient Position: Sitting, Cuff Size: Normal)   Pulse 66   Temp 97.9 F (36.6 C) (Oral)   Ht 5' 9 (1.753 m)   Wt 164 lb (74.4 kg)   SpO2 99%   BMI 24.22 kg/m   Wt Readings from Last 3 Encounters:  03/30/24 164 lb (74.4 kg)  03/14/24 176 lb (79.8 kg)  11/29/23 168 lb (76.2 kg)    Physical Exam General appearance - alert, well appearing, older Hispanic male and in no distress Mental status - normal mood, behavior, speech, dress, motor activity, and thought processes Chest - clear to auscultation, no wheezes, rales or rhonchi, symmetric air entry Heart - normal rate, regular rhythm, normal S1, S2, no murmurs, rubs, clicks or gallops Extremities - peripheral pulses normal, no pedal edema, no clubbing or cyanosis     Latest Ref Rng & Units 11/29/2023    9:21 AM 04/07/2023   11:00 AM 01/30/2023    4:17 AM   CMP  Glucose 70 - 99 mg/dL 86  67  893   BUN 8 - 27 mg/dL 16  19  29    Creatinine 0.76 - 1.27 mg/dL 9.20  9.18  8.66   Sodium 134 - 144 mmol/L 138  141  134   Potassium 3.5 - 5.2 mmol/L 4.7  4.7  4.1   Chloride 96 - 106 mmol/L 101  102  102   CO2 20 - 29 mmol/L 23  27  21    Calcium 8.6 - 10.2 mg/dL 9.1  9.1  8.8   Total Protein 6.0 - 8.5 g/dL 6.5     Total Bilirubin 0.0 - 1.2 mg/dL 0.2     Alkaline Phos 44 - 121 IU/L 66     AST 0 - 40 IU/L 22  ALT 0 - 44 IU/L 17      Lipid Panel     Component Value Date/Time   CHOL 188 11/29/2023 0921   TRIG 77 11/29/2023 0921   HDL 74 11/29/2023 0921   CHOLHDL 2.5 11/29/2023 0921   CHOLHDL 2.1 03/14/2014 1220   VLDL 17 03/14/2014 1220   LDLCALC 100 (H) 11/29/2023 0921    CBC    Component Value Date/Time   WBC 5.5 11/29/2023 0921   WBC 8.3 01/29/2023 2331   RBC 5.15 11/29/2023 0921   RBC 5.10 01/29/2023 2331   HGB 15.8 11/29/2023 0921   HCT 47.9 11/29/2023 0921   PLT 244 11/29/2023 0921   MCV 93 11/29/2023 0921   MCH 30.7 11/29/2023 0921   MCH 30.4 01/29/2023 2331   MCHC 33.0 11/29/2023 0921   MCHC 34.6 01/29/2023 2331   RDW 13.6 11/29/2023 0921   LYMPHSABS 2.1 04/25/2019 1429   MONOABS 0.4 12/11/2013 1546   EOSABS 0.1 04/25/2019 1429   BASOSABS 0.0 04/25/2019 1429   Lab Results  Component Value Date   ESRSEDRATE 2 11/29/2023   Lab Results  Component Value Date   RF 10.6 11/29/2023    ASSESSMENT AND PLAN:  Assessment and Plan Assessment & Plan SNHL Confirmed hearing loss, more pronounced on the left side. - Ensure attendance for brain MRI on April 04, 2024, at [facility] Radiology Department. - Follow the appointment date in the letter for ENT specialist consultation on April 14, 2024.  Osteoarthritis of hands and knees Osteoarthritis affecting hands and knees, with intermittent symptoms.  Advised patient that Celebrex  is meant to be taken daily but if he does not feel that he needs to take it daily he can  take it as needed. - Educated on the importance of taking Celebrex  daily for symptom management.  Obstructive sleep apnea Obstructive sleep apnea with intolerance to current CPAP mask due to discomfort. - Refer for mask desensitization to trial a nasal or mouth mask.  Need for Flu vaccine Due for influenza and pneumonia vaccinations. - Administer flu shot today. - Advise to obtain pneumonia vaccine from the health department at [address] [facility].   Patient was given the opportunity to ask questions.  Patient verbalized understanding of the plan and was able to repeat key elements of the plan.   This documentation was completed using Paediatric nurse.  Any transcriptional errors are unintentional.  Orders Placed This Encounter  Procedures   Desensitization mask fit     Requested Prescriptions    No prescriptions requested or ordered in this encounter    Return in about 5 months (around 08/28/2024).  Barnie Louder, MD, FACP

## 2024-03-30 NOTE — Patient Instructions (Signed)
  VISIT SUMMARY: Today, you had a follow-up appointment to discuss your hearing loss and arthritis management. We reviewed your recent hearing evaluation, which confirmed hearing loss, especially on the left side. We also discussed your arthritis symptoms and your history of sleep apnea.  YOUR PLAN: -HEARING LOSS, WORSE ON LEFT SIDE: You have confirmed hearing loss, more pronounced on the left side. It is important to attend your brain MRI on April 04, 2024, at [facility] Radiology Department. Additionally, you have an appointment with an ENT specialist on April 14, 2024, to discuss potential surgery after reviewing the MRI results.  -OSTEOARTHRITIS OF HANDS AND KNEES: Osteoarthritis is a condition that causes joint pain and stiffness. It is important to take Celebrex  daily to manage your symptoms effectively. Please ensure you take your medication regularly to help with the discomfort in your fingers and knees.  -OBSTRUCTIVE SLEEP APNEA: Obstructive sleep apnea is a condition where your breathing stops and starts during sleep. Since you are experiencing discomfort with your current CPAP mask, we will refer you for mask desensitization to try a nasal or mouth mask that may be more comfortable.  -GENERAL HEALTH MAINTENANCE: You are due for your influenza and pneumonia vaccinations. We will administer the flu shot today. Please visit the health department at [address] [facility] to obtain your pneumonia vaccine.  INSTRUCTIONS: Please ensure you attend your brain MRI on April 04, 2024, and your ENT specialist appointment on April 14, 2024. Take Celebrex  daily for your arthritis symptoms. Follow up with the referral for mask desensitization to find a more comfortable CPAP mask. Obtain your pneumonia vaccine from the health department at [address] [facility].                      Contains text generated by Abridge.                                  Contains text generated by Abridge.

## 2024-04-04 ENCOUNTER — Ambulatory Visit (HOSPITAL_COMMUNITY)
Admission: RE | Admit: 2024-04-04 | Discharge: 2024-04-04 | Disposition: A | Discharge status: Home / Self Care | Payer: Self-pay | Source: Ambulatory Visit

## 2024-04-04 DIAGNOSIS — H918X3 Other specified hearing loss, bilateral: Secondary | ICD-10-CM

## 2024-04-04 MED ORDER — GADOBUTROL 1 MMOL/ML IV SOLN
7.5000 mL | Freq: Once | INTRAVENOUS | Status: AC | PRN
  Administered 2024-04-04: 7.5 mL via INTRAVENOUS

## 2024-06-18 ENCOUNTER — Ambulatory Visit (INDEPENDENT_AMBULATORY_CARE_PROVIDER_SITE_OTHER): Payer: Self-pay

## 2024-07-24 ENCOUNTER — Telehealth (INDEPENDENT_AMBULATORY_CARE_PROVIDER_SITE_OTHER): Payer: Self-pay

## 2024-07-24 ENCOUNTER — Encounter (INDEPENDENT_AMBULATORY_CARE_PROVIDER_SITE_OTHER): Payer: Self-pay

## 2024-07-24 NOTE — Telephone Encounter (Signed)
 I spoke with nurse at [facility] in [facility]. She stated that they have patient scheduled from referral from Dr. Anice, however they did not get any notes or audio results from or office for that referral. I informed her that I will let our referral team know to send the notes and audiogram.Their fax # is [phone].

## 2024-08-28 ENCOUNTER — Ambulatory Visit: Payer: Self-pay | Admitting: Internal Medicine
